# Patient Record
Sex: Female | Born: 1940 | Race: White | Hispanic: No | Smoking: Former smoker
Health system: Southern US, Community
[De-identification: ages and names within clinical notes are randomized; demographics above are authoritative.]

## PROBLEM LIST (undated history)

## (undated) DIAGNOSIS — J302 Other seasonal allergic rhinitis: Secondary | ICD-10-CM

## (undated) DIAGNOSIS — K5730 Diverticulosis of large intestine without perforation or abscess without bleeding: Secondary | ICD-10-CM

## (undated) DIAGNOSIS — R002 Palpitations: Secondary | ICD-10-CM

## (undated) DIAGNOSIS — I10 Essential (primary) hypertension: Secondary | ICD-10-CM

## (undated) DIAGNOSIS — I471 Supraventricular tachycardia, unspecified: Secondary | ICD-10-CM

## (undated) DIAGNOSIS — M1990 Unspecified osteoarthritis, unspecified site: Secondary | ICD-10-CM

## (undated) HISTORY — DX: Essential (primary) hypertension: I10

## (undated) HISTORY — DX: Diverticulosis of large intestine without perforation or abscess without bleeding: K57.30

## (undated) HISTORY — DX: Other seasonal allergic rhinitis: J30.2

## (undated) HISTORY — DX: Supraventricular tachycardia: I47.1

## (undated) HISTORY — PX: DILATION AND CURETTAGE OF UTERUS: SHX78

## (undated) HISTORY — DX: Unspecified osteoarthritis, unspecified site: M19.90

## (undated) HISTORY — DX: Palpitations: R00.2

## (undated) HISTORY — DX: Supraventricular tachycardia, unspecified: I47.10

---

## 1997-02-15 ENCOUNTER — Ambulatory Visit (HOSPITAL_COMMUNITY): Admission: RE | Admit: 1997-02-15 | Discharge: 1997-02-15 | Payer: Self-pay | Admitting: Obstetrics and Gynecology

## 1998-02-18 ENCOUNTER — Other Ambulatory Visit: Admission: RE | Admit: 1998-02-18 | Discharge: 1998-02-18 | Payer: Self-pay | Admitting: Obstetrics and Gynecology

## 1998-02-18 ENCOUNTER — Encounter: Payer: Self-pay | Admitting: Obstetrics and Gynecology

## 1998-02-18 ENCOUNTER — Ambulatory Visit (HOSPITAL_COMMUNITY): Admission: RE | Admit: 1998-02-18 | Discharge: 1998-02-18 | Payer: Self-pay | Admitting: Obstetrics and Gynecology

## 1999-03-24 ENCOUNTER — Ambulatory Visit (HOSPITAL_COMMUNITY): Admission: RE | Admit: 1999-03-24 | Discharge: 1999-03-24 | Payer: Self-pay | Admitting: Obstetrics and Gynecology

## 1999-03-24 ENCOUNTER — Encounter: Payer: Self-pay | Admitting: Obstetrics and Gynecology

## 1999-03-24 ENCOUNTER — Other Ambulatory Visit: Admission: RE | Admit: 1999-03-24 | Discharge: 1999-03-24 | Payer: Self-pay | Admitting: Obstetrics and Gynecology

## 1999-04-06 ENCOUNTER — Encounter: Payer: Self-pay | Admitting: Obstetrics and Gynecology

## 1999-04-06 ENCOUNTER — Ambulatory Visit (HOSPITAL_COMMUNITY): Admission: RE | Admit: 1999-04-06 | Discharge: 1999-04-06 | Payer: Self-pay | Admitting: Obstetrics and Gynecology

## 2000-03-22 ENCOUNTER — Encounter: Payer: Self-pay | Admitting: Obstetrics and Gynecology

## 2000-03-22 ENCOUNTER — Ambulatory Visit (HOSPITAL_COMMUNITY): Admission: RE | Admit: 2000-03-22 | Discharge: 2000-03-22 | Payer: Self-pay | Admitting: Obstetrics and Gynecology

## 2000-03-22 ENCOUNTER — Other Ambulatory Visit: Admission: RE | Admit: 2000-03-22 | Discharge: 2000-03-22 | Payer: Self-pay | Admitting: Obstetrics and Gynecology

## 2001-04-17 ENCOUNTER — Ambulatory Visit (HOSPITAL_COMMUNITY): Admission: RE | Admit: 2001-04-17 | Discharge: 2001-04-17 | Payer: Self-pay | Admitting: Obstetrics and Gynecology

## 2001-04-17 ENCOUNTER — Other Ambulatory Visit: Admission: RE | Admit: 2001-04-17 | Discharge: 2001-04-17 | Payer: Self-pay | Admitting: Obstetrics and Gynecology

## 2001-04-17 ENCOUNTER — Encounter: Payer: Self-pay | Admitting: Obstetrics and Gynecology

## 2002-05-04 ENCOUNTER — Encounter: Payer: Self-pay | Admitting: Obstetrics and Gynecology

## 2002-05-04 ENCOUNTER — Ambulatory Visit (HOSPITAL_COMMUNITY): Admission: RE | Admit: 2002-05-04 | Discharge: 2002-05-04 | Payer: Self-pay | Admitting: Obstetrics and Gynecology

## 2003-03-01 ENCOUNTER — Encounter: Admission: RE | Admit: 2003-03-01 | Discharge: 2003-03-01 | Payer: Self-pay | Admitting: Obstetrics and Gynecology

## 2003-05-18 ENCOUNTER — Encounter: Admission: RE | Admit: 2003-05-18 | Discharge: 2003-05-18 | Payer: Self-pay | Admitting: Obstetrics and Gynecology

## 2003-05-18 ENCOUNTER — Other Ambulatory Visit: Admission: RE | Admit: 2003-05-18 | Discharge: 2003-05-18 | Payer: Self-pay | Admitting: Obstetrics and Gynecology

## 2004-05-22 ENCOUNTER — Encounter: Admission: RE | Admit: 2004-05-22 | Discharge: 2004-05-22 | Payer: Self-pay | Admitting: Obstetrics and Gynecology

## 2004-05-22 ENCOUNTER — Other Ambulatory Visit: Admission: RE | Admit: 2004-05-22 | Discharge: 2004-05-22 | Payer: Self-pay | Admitting: Obstetrics and Gynecology

## 2004-05-31 ENCOUNTER — Encounter: Admission: RE | Admit: 2004-05-31 | Discharge: 2004-05-31 | Payer: Self-pay | Admitting: Obstetrics and Gynecology

## 2005-06-11 ENCOUNTER — Other Ambulatory Visit: Admission: RE | Admit: 2005-06-11 | Discharge: 2005-06-11 | Payer: Self-pay | Admitting: Obstetrics and Gynecology

## 2005-06-11 ENCOUNTER — Encounter: Admission: RE | Admit: 2005-06-11 | Discharge: 2005-06-11 | Payer: Self-pay | Admitting: Obstetrics and Gynecology

## 2005-06-28 ENCOUNTER — Encounter: Admission: RE | Admit: 2005-06-28 | Discharge: 2005-06-28 | Payer: Self-pay | Admitting: Obstetrics and Gynecology

## 2006-06-13 ENCOUNTER — Encounter: Admission: RE | Admit: 2006-06-13 | Discharge: 2006-06-13 | Payer: Self-pay | Admitting: Obstetrics and Gynecology

## 2006-06-13 ENCOUNTER — Other Ambulatory Visit: Admission: RE | Admit: 2006-06-13 | Discharge: 2006-06-13 | Payer: Self-pay | Admitting: Obstetrics and Gynecology

## 2006-10-02 ENCOUNTER — Ambulatory Visit: Payer: Self-pay | Admitting: Gastroenterology

## 2006-10-16 ENCOUNTER — Ambulatory Visit: Payer: Self-pay | Admitting: Gastroenterology

## 2007-06-18 ENCOUNTER — Encounter: Admission: RE | Admit: 2007-06-18 | Discharge: 2007-06-18 | Payer: Self-pay | Admitting: Obstetrics and Gynecology

## 2008-07-20 ENCOUNTER — Other Ambulatory Visit: Admission: RE | Admit: 2008-07-20 | Discharge: 2008-07-20 | Payer: Self-pay | Admitting: Obstetrics and Gynecology

## 2008-07-20 ENCOUNTER — Encounter: Admission: RE | Admit: 2008-07-20 | Discharge: 2008-07-20 | Payer: Self-pay | Admitting: Obstetrics and Gynecology

## 2008-07-26 ENCOUNTER — Encounter: Admission: RE | Admit: 2008-07-26 | Discharge: 2008-07-26 | Payer: Self-pay | Admitting: Obstetrics and Gynecology

## 2009-07-21 ENCOUNTER — Encounter: Admission: RE | Admit: 2009-07-21 | Discharge: 2009-07-21 | Payer: Self-pay | Admitting: Internal Medicine

## 2010-01-29 ENCOUNTER — Encounter: Payer: Self-pay | Admitting: Obstetrics and Gynecology

## 2010-04-18 ENCOUNTER — Other Ambulatory Visit: Payer: Self-pay | Admitting: Internal Medicine

## 2010-04-18 DIAGNOSIS — Z1231 Encounter for screening mammogram for malignant neoplasm of breast: Secondary | ICD-10-CM

## 2010-07-24 ENCOUNTER — Ambulatory Visit
Admission: RE | Admit: 2010-07-24 | Discharge: 2010-07-24 | Disposition: A | Discharge status: Home / Self Care | Payer: Medicare HMO | Source: Ambulatory Visit | Attending: Internal Medicine | Admitting: Internal Medicine

## 2010-07-24 DIAGNOSIS — Z1231 Encounter for screening mammogram for malignant neoplasm of breast: Secondary | ICD-10-CM

## 2011-05-07 ENCOUNTER — Encounter: Payer: Self-pay | Admitting: Gastroenterology

## 2011-06-19 ENCOUNTER — Other Ambulatory Visit: Payer: Self-pay | Admitting: Internal Medicine

## 2011-06-19 DIAGNOSIS — Z1231 Encounter for screening mammogram for malignant neoplasm of breast: Secondary | ICD-10-CM

## 2011-08-28 ENCOUNTER — Ambulatory Visit: Payer: Medicare HMO

## 2011-08-28 ENCOUNTER — Ambulatory Visit
Admission: RE | Admit: 2011-08-28 | Discharge: 2011-08-28 | Disposition: A | Discharge status: Home / Self Care | Payer: PRIVATE HEALTH INSURANCE | Source: Ambulatory Visit | Attending: Internal Medicine | Admitting: Internal Medicine

## 2011-08-28 DIAGNOSIS — Z1231 Encounter for screening mammogram for malignant neoplasm of breast: Secondary | ICD-10-CM

## 2012-05-01 ENCOUNTER — Other Ambulatory Visit: Payer: Self-pay

## 2012-05-01 DIAGNOSIS — Z1231 Encounter for screening mammogram for malignant neoplasm of breast: Secondary | ICD-10-CM

## 2012-09-10 ENCOUNTER — Ambulatory Visit
Admission: RE | Admit: 2012-09-10 | Discharge: 2012-09-10 | Disposition: A | Discharge status: Home / Self Care | Payer: Medicare Other | Source: Ambulatory Visit

## 2012-09-10 DIAGNOSIS — Z1231 Encounter for screening mammogram for malignant neoplasm of breast: Secondary | ICD-10-CM

## 2013-06-22 ENCOUNTER — Other Ambulatory Visit: Payer: Self-pay

## 2013-06-22 DIAGNOSIS — Z1231 Encounter for screening mammogram for malignant neoplasm of breast: Secondary | ICD-10-CM

## 2013-09-11 ENCOUNTER — Ambulatory Visit
Admission: RE | Admit: 2013-09-11 | Discharge: 2013-09-11 | Disposition: A | Discharge status: Home / Self Care | Payer: Medicare HMO | Source: Ambulatory Visit

## 2013-09-11 DIAGNOSIS — Z1231 Encounter for screening mammogram for malignant neoplasm of breast: Secondary | ICD-10-CM

## 2014-06-25 ENCOUNTER — Other Ambulatory Visit: Payer: Self-pay

## 2014-06-25 DIAGNOSIS — Z1231 Encounter for screening mammogram for malignant neoplasm of breast: Secondary | ICD-10-CM

## 2014-09-24 ENCOUNTER — Ambulatory Visit
Admission: RE | Admit: 2014-09-24 | Discharge: 2014-09-24 | Disposition: A | Discharge status: Home / Self Care | Payer: Medicare HMO | Source: Ambulatory Visit

## 2014-09-24 DIAGNOSIS — Z1231 Encounter for screening mammogram for malignant neoplasm of breast: Secondary | ICD-10-CM

## 2015-01-13 DIAGNOSIS — Z418 Encounter for other procedures for purposes other than remedying health state: Secondary | ICD-10-CM | POA: Diagnosis not present

## 2015-01-13 DIAGNOSIS — J329 Chronic sinusitis, unspecified: Secondary | ICD-10-CM | POA: Diagnosis not present

## 2015-01-13 DIAGNOSIS — Z789 Other specified health status: Secondary | ICD-10-CM | POA: Diagnosis not present

## 2015-04-01 DIAGNOSIS — I10 Essential (primary) hypertension: Secondary | ICD-10-CM | POA: Diagnosis not present

## 2015-04-01 DIAGNOSIS — R42 Dizziness and giddiness: Secondary | ICD-10-CM | POA: Diagnosis not present

## 2015-04-01 DIAGNOSIS — K649 Unspecified hemorrhoids: Secondary | ICD-10-CM | POA: Diagnosis not present

## 2015-04-01 DIAGNOSIS — R002 Palpitations: Secondary | ICD-10-CM | POA: Diagnosis not present

## 2015-04-01 DIAGNOSIS — Z6835 Body mass index (BMI) 35.0-35.9, adult: Secondary | ICD-10-CM | POA: Diagnosis not present

## 2015-04-04 DIAGNOSIS — R002 Palpitations: Secondary | ICD-10-CM | POA: Diagnosis not present

## 2015-04-11 DIAGNOSIS — R002 Palpitations: Secondary | ICD-10-CM | POA: Diagnosis not present

## 2015-04-18 DIAGNOSIS — R002 Palpitations: Secondary | ICD-10-CM | POA: Diagnosis not present

## 2015-04-21 DIAGNOSIS — R002 Palpitations: Secondary | ICD-10-CM | POA: Diagnosis not present

## 2015-04-28 ENCOUNTER — Encounter: Payer: Self-pay | Admitting: *Deleted

## 2015-04-29 ENCOUNTER — Encounter: Payer: Self-pay | Admitting: Cardiology

## 2015-04-29 ENCOUNTER — Ambulatory Visit (INDEPENDENT_AMBULATORY_CARE_PROVIDER_SITE_OTHER): Payer: PPO | Admitting: Cardiology

## 2015-04-29 VITALS — BP 112/73 | HR 75 | Ht 68.0 in | Wt 230.2 lb

## 2015-04-29 DIAGNOSIS — Z136 Encounter for screening for cardiovascular disorders: Secondary | ICD-10-CM

## 2015-04-29 DIAGNOSIS — F419 Anxiety disorder, unspecified: Secondary | ICD-10-CM | POA: Diagnosis not present

## 2015-04-29 DIAGNOSIS — I471 Supraventricular tachycardia: Secondary | ICD-10-CM

## 2015-04-29 DIAGNOSIS — I10 Essential (primary) hypertension: Secondary | ICD-10-CM | POA: Diagnosis not present

## 2015-04-29 MED ORDER — METOPROLOL SUCCINATE ER 25 MG PO TB24: 12.5000 mg | ORAL_TABLET | Freq: Every day | ORAL | Status: DC

## 2015-04-29 MED ORDER — LISINOPRIL 2.5 MG PO TABS: 2.5000 mg | ORAL_TABLET | Freq: Every day | ORAL | Status: DC

## 2015-04-29 NOTE — Patient Instructions (Signed)
Your physician has recommended you make the following change in your medication:  Decrease lisinopril to 2.5 mg daily. You may break your 5 mg tablets in half daily until they are finished. Start toprol xl (metoprolol succinate) 12.5 mg daily. Continue all other medications the same. Your physician recommends that you schedule a follow-up appointment in: 3 months.

## 2015-04-29 NOTE — Progress Notes (Signed)
Cardiology Office Note  Date: 04/29/2015   ID: Jermaya, Drucker November 11, 1940, MRN [id number]  PCP: Glenda Chroman, MD  Consulting Cardiologist: Rozann Lesches, MD   Chief Complaint  Patient presents with  . NSVT  . PSVT    History of Present Illness: Meredith Phelps is a 75 y.o. female referred for cardiology consultation by Dr. Woody Seller.I reviewed her history. She states that she has been feeling brief episodes of palpitations, sometimes when she is still at nighttime. She has this rarely during the daytime, has intermittent vertigo which is longer standing, but has had no sudden dizziness or syncope. She does not report any exertional chest pain and has NYHA class II dyspnea.  She wore a recent cardiac monitor per Dr. Woody Seller, I reviewed the available strips. Sinus rhythm was noted as well as rare PVCs and PACs. A total of 13 brief episodes of SVT were noted, longest lasting 19 beats. There was also a single 8 beat run of wide complex tachycardia that was either NSVT, or possibly aberrant SVT. She tells me that she also had an echocardiogram done through the office at [facility], reportedly this looked "okay."  I went over the results of the monitor and showed her the strips. We talked about considering use of a low-dose beta blocker. I went over her remaining medications. She states that her blood pressure is sometimes "low," we also discussed cutting back her lisinopril with the addition of a beta blocker.  Past Medical History  Diagnosis Date  . Essential hypertension   . Palpitations   . Seasonal allergies   . Diverticulosis of colon   . Degenerative joint disease     History reviewed. No pertinent past surgical history.  Current Outpatient Prescriptions  Medication Sig Dispense Refill  . acetaminophen (TYLENOL) 325 MG tablet Take 650 mg by mouth as needed (tries to wait 12 hours after the Aleve).    . diazepam (VALIUM) 5 MG tablet Take 2.5 mg by mouth as needed for  anxiety (nerves, has not taken in about 2 years till recently.).    Marland Kitchen furosemide (LASIX) 20 MG tablet Take 20 mg by mouth daily as needed.    . hydrocortisone 2.5 % cream Apply 1 application topically 3 (three) times daily as needed.    . meclizine (ANTIVERT) 25 MG tablet Take 12.5-25 mg by mouth every 6 (six) hours as needed for dizziness.    . Naproxen Sodium (ALEVE PO) Take 2 tablets by mouth every morning.    . potassium chloride (K-DUR) 10 MEQ tablet Take 10 mEq by mouth daily as needed.    . sodium chloride (OCEAN) 0.65 % SOLN nasal spray Place 1 spray into both nostrils as needed for congestion.    Marland Kitchen lisinopril (PRINIVIL,ZESTRIL) 2.5 MG tablet Take 1 tablet (2.5 mg total) by mouth daily. 90 tablet 3  . metoprolol succinate (TOPROL XL) 25 MG 24 hr tablet Take 0.5 tablets (12.5 mg total) by mouth daily. 45 tablet 3   No current facility-administered medications for this visit.   Allergies:  Ivp dye; Shellfish allergy; and Penicillins   Social History: The patient  reports that she has quit smoking. Her smoking use included Cigarettes. She does not have any smokeless tobacco history on file. She reports that she does not drink alcohol or use illicit drugs.   Family History: The patient's family history includes Heart disease in her mother; Heart failure in her mother.   ROS:  Please see  the history of present illness. Otherwise, complete review of systems is positive for arthritic symptoms.  All other systems are reviewed and negative.   Physical Exam: VS:  BP 112/73 mmHg  Pulse 75  Ht 5\' 8"  (1.727 m)  Wt 230 lb 3.2 oz (104.418 kg)  BMI 35.01 kg/m2  SpO2 94%, BMI Body mass index is 35.01 kg/(m^2).  Wt Readings from Last 3 Encounters:  04/29/15 230 lb 3.2 oz (104.418 kg)    General: Overweight woman, appears comfortable at rest. HEENT: Conjunctiva and lids normal, oropharynx clear. Neck: Supple, no elevated JVP or carotid bruits, no thyromegaly. Lungs: Clear to auscultation,  nonlabored breathing at rest. Cardiac: Regular rate and rhythm, no S3 or significant systolic murmur, no pericardial rub. Abdomen: Soft, nontender, bowel sounds present, no guarding or rebound. Extremities: No pitting edema, distal pulses 2+. Skin: Warm and dry. Musculoskeletal: No kyphosis. Neuropsychiatric: Alert and oriented x3, affect grossly appropriate.  ECG: I reviewed her ECG today which shows sinus rhythm with poor R-wave progression.  Assessment and Plan:  1. History of palpitations, no syncope or chest pain. Recent cardiac monitor shows predominantly episodes of brief SVT, one 8 beat wide complex run which could have been NSVT or perhaps aberrent SVT. She reportedly has normal LVEF by recent echocardiogram, requesting results from [facility] to confirm. In that case would recommend observation and addition of low-dose beta blocker. We are starting Toprol-XL 12.5 mg daily, concurrently reduced dose of lisinopril. Follow-up arranged.  2. Reported history of hypertension, blood pressure is normal today. She has been on lisinopril.  3. Patient also reports problems with anxiety and intermittent panic attacks. She takes Valium as needed.  Current medicines were reviewed with the patient today.   Orders Placed This Encounter  Procedures  . EKG 12-Lead    Disposition: FU with me in 3 months.   Signed, Meredith Sark, MD, [facility] 04/29/2015 4:22 PM    [place] [place] [facility] [place], [place], [place] [address] Phone: [phone]; Fax: [phone]

## 2015-05-17 DIAGNOSIS — Z961 Presence of intraocular lens: Secondary | ICD-10-CM | POA: Diagnosis not present

## 2015-05-17 DIAGNOSIS — H1045 Other chronic allergic conjunctivitis: Secondary | ICD-10-CM | POA: Diagnosis not present

## 2015-07-13 DIAGNOSIS — Z87891 Personal history of nicotine dependence: Secondary | ICD-10-CM | POA: Diagnosis not present

## 2015-07-13 DIAGNOSIS — J069 Acute upper respiratory infection, unspecified: Secondary | ICD-10-CM | POA: Diagnosis not present

## 2015-07-13 DIAGNOSIS — G4700 Insomnia, unspecified: Secondary | ICD-10-CM | POA: Diagnosis not present

## 2015-07-13 DIAGNOSIS — Z299 Encounter for prophylactic measures, unspecified: Secondary | ICD-10-CM | POA: Diagnosis not present

## 2015-07-31 NOTE — Progress Notes (Signed)
Cardiology Office Note  Date: 08/01/2015   ID: Jozey, Schooler 06/08/1940, MRN [id number]  PCP: Glenda Chroman, MD  Primary Cardiologist: Rozann Lesches, MD   Chief Complaint  Patient presents with  . PSVT    History of Present Illness: BRIANI MARICONDA is a 75 y.o. female last seen in April. At that time she was started on low-dose Toprol-XL for management of PSVT. She states that this has been quite effective, does not report any substantial palpitations. Overall pleased with her current symptom control.  She reports a recent URI, treated by Dr. Woody Seller. Cough and chest congestion, some mild wheezing.  Past Medical History:  Diagnosis Date  . Degenerative joint disease   . Diverticulosis of colon   . Essential hypertension   . Palpitations   . Seasonal allergies     Current Outpatient Prescriptions  Medication Sig Dispense Refill  . acetaminophen (TYLENOL) 325 MG tablet Take 650 mg by mouth as needed (tries to wait 12 hours after the Aleve).    . diazepam (VALIUM) 5 MG tablet Take 2.5 mg by mouth as needed for anxiety (nerves, has not taken in about 2 years till recently.).    Marland Kitchen furosemide (LASIX) 20 MG tablet Take 20 mg by mouth daily as needed.    . hydrocortisone 2.5 % cream Apply 1 application topically 3 (three) times daily as needed.    Marland Kitchen lisinopril (PRINIVIL,ZESTRIL) 2.5 MG tablet Take 1 tablet (2.5 mg total) by mouth daily. 90 tablet 3  . meclizine (ANTIVERT) 25 MG tablet Take 12.5-25 mg by mouth every 6 (six) hours as needed for dizziness.    . metoprolol succinate (TOPROL XL) 25 MG 24 hr tablet Take 0.5 tablets (12.5 mg total) by mouth daily. 45 tablet 3  . Naproxen Sodium (ALEVE PO) Take 2 tablets by mouth every morning.    . potassium chloride (K-DUR) 10 MEQ tablet Take 10 mEq by mouth daily as needed.    . sodium chloride (OCEAN) 0.65 % SOLN nasal spray Place 1 spray into both nostrils as needed for congestion.     No current facility-administered  medications for this visit.    Allergies:  Ivp dye [iodinated diagnostic agents]; Shellfish allergy; and Penicillins   Social History: The patient  reports that she has quit smoking. Her smoking use included Cigarettes. She has never used smokeless tobacco. She reports that she does not drink alcohol or use drugs.   ROS:  Please see the history of present illness. Otherwise, complete review of systems is positive for none.  All other systems are reviewed and negative.   Physical Exam: VS:  BP (!) 144/81   Pulse 71   Ht 5\' 8"  (1.727 m)   Wt 228 lb 3.2 oz (103.5 kg)   SpO2 95%   BMI 34.70 kg/m , BMI Body mass index is 34.7 kg/m.  Wt Readings from Last 3 Encounters:  08/01/15 228 lb 3.2 oz (103.5 kg)  04/29/15 230 lb 3.2 oz (104.4 kg)    General: Overweight woman, appears comfortable at rest. HEENT: Conjunctiva and lids normal, oropharynx clear. Neck: Supple, no elevated JVP or carotid bruits, no thyromegaly. Lungs: Clear to auscultation, nonlabored breathing at rest. Cardiac: Regular rate and rhythm, no S3 or significant systolic murmur, no pericardial rub. Abdomen: Soft, nontender, bowel sounds present, no guarding or rebound. Extremities: No pitting edema, distal pulses 2+.  ECG: I personally reviewed the tracing from 04/29/2015 which showed normal sinus rhythm with decreased R  wave progression.  Other Studies Reviewed Today:  Echocardiogram 04/11/2015 [facility]): LVEF [id number], grade 1 diastolic dysfunction, mild left atrial enlargement, trace mitral regurgitation, mildly sclerotic aortic valve, normal estimated RVSP, trivial posterior basal pericardial effusion.  Assessment and Plan:  1. PSVT, typically brief episodes but history of palpitations. This is well controlled now on low-dose Toprol-XL. Continue observation.  2. Essential hypertension. No changes made to current regimen. Keep follow-up with Dr. Woody Seller. Discussed exercise and weight loss today.  Current  medicines were reviewed with the patient today.  Disposition: Follow-up with me in 6 months.  Signed, Satira Sark, MD, [facility] 08/01/2015 10:15 AM    [place] [facility] [place], [place], [place] [address] Phone: [phone]; Fax: [phone]

## 2015-08-01 ENCOUNTER — Encounter: Payer: Self-pay | Admitting: Cardiology

## 2015-08-01 ENCOUNTER — Ambulatory Visit (INDEPENDENT_AMBULATORY_CARE_PROVIDER_SITE_OTHER): Payer: PPO | Admitting: Cardiology

## 2015-08-01 VITALS — BP 144/81 | HR 71 | Ht 68.0 in | Wt 228.2 lb

## 2015-08-01 DIAGNOSIS — I10 Essential (primary) hypertension: Secondary | ICD-10-CM | POA: Diagnosis not present

## 2015-08-01 DIAGNOSIS — I471 Supraventricular tachycardia: Secondary | ICD-10-CM

## 2015-08-01 NOTE — Patient Instructions (Signed)

## 2015-10-04 DIAGNOSIS — Z7189 Other specified counseling: Secondary | ICD-10-CM | POA: Diagnosis not present

## 2015-10-04 DIAGNOSIS — Z1211 Encounter for screening for malignant neoplasm of colon: Secondary | ICD-10-CM | POA: Diagnosis not present

## 2015-10-04 DIAGNOSIS — Z1389 Encounter for screening for other disorder: Secondary | ICD-10-CM | POA: Diagnosis not present

## 2015-10-04 DIAGNOSIS — Z0000 Encounter for general adult medical examination without abnormal findings: Secondary | ICD-10-CM | POA: Diagnosis not present

## 2015-10-04 DIAGNOSIS — R5383 Other fatigue: Secondary | ICD-10-CM | POA: Diagnosis not present

## 2015-10-04 DIAGNOSIS — Z299 Encounter for prophylactic measures, unspecified: Secondary | ICD-10-CM | POA: Diagnosis not present

## 2015-10-04 DIAGNOSIS — Z79899 Other long term (current) drug therapy: Secondary | ICD-10-CM | POA: Diagnosis not present

## 2015-10-10 ENCOUNTER — Other Ambulatory Visit: Payer: Self-pay | Admitting: Internal Medicine

## 2015-10-10 DIAGNOSIS — Z1231 Encounter for screening mammogram for malignant neoplasm of breast: Secondary | ICD-10-CM

## 2015-11-02 ENCOUNTER — Ambulatory Visit
Admission: RE | Admit: 2015-11-02 | Discharge: 2015-11-02 | Disposition: A | Discharge status: Home / Self Care | Payer: PPO | Source: Ambulatory Visit | Attending: Internal Medicine | Admitting: Internal Medicine

## 2015-11-02 DIAGNOSIS — Z1231 Encounter for screening mammogram for malignant neoplasm of breast: Secondary | ICD-10-CM

## 2015-11-03 ENCOUNTER — Other Ambulatory Visit: Payer: Self-pay | Admitting: Internal Medicine

## 2015-11-03 DIAGNOSIS — R928 Other abnormal and inconclusive findings on diagnostic imaging of breast: Secondary | ICD-10-CM

## 2015-11-09 ENCOUNTER — Other Ambulatory Visit: Payer: PPO

## 2015-11-11 ENCOUNTER — Ambulatory Visit
Admission: RE | Admit: 2015-11-11 | Discharge: 2015-11-11 | Disposition: A | Discharge status: Home / Self Care | Payer: PPO | Source: Ambulatory Visit | Attending: Internal Medicine | Admitting: Internal Medicine

## 2015-11-11 DIAGNOSIS — R928 Other abnormal and inconclusive findings on diagnostic imaging of breast: Secondary | ICD-10-CM

## 2015-11-11 DIAGNOSIS — N6489 Other specified disorders of breast: Secondary | ICD-10-CM | POA: Diagnosis not present

## 2015-11-11 DIAGNOSIS — R922 Inconclusive mammogram: Secondary | ICD-10-CM | POA: Diagnosis not present

## 2016-01-25 ENCOUNTER — Ambulatory Visit: Payer: PPO | Admitting: Cardiology

## 2016-02-17 ENCOUNTER — Ambulatory Visit: Payer: PPO | Admitting: Cardiology

## 2016-03-21 NOTE — Progress Notes (Signed)
Cardiology Office Note  Date: 03/23/2016   ID: Meredith Phelps, Alferd Apa May 05, 1940, MRN [id number]  PCP: Glenda Chroman, MD  Primary Cardiologist: Rozann Lesches, MD   Chief Complaint  Patient presents with  . PSVT    History of Present Illness: Meredith Phelps is a 76 y.o. female last seen in July 2017. She presents for a routine follow-up visit. Since last encounter she does not report any significant breakthrough palpitations. No chest pain or unusual shortness of breath. She reports compliance with her medications including low-dose Toprol-XL 12.5 mg daily.  I personally reviewed her ECG today which shows sinus rhythm with poor R wave progression.  Past Medical History:  Diagnosis Date  . Degenerative joint disease   . Diverticulosis of colon   . Essential hypertension   . Palpitations   . PSVT (paroxysmal supraventricular tachycardia) ([place])   . Seasonal allergies     Current Outpatient Prescriptions  Medication Sig Dispense Refill  . acetaminophen (TYLENOL) 325 MG tablet Take 650 mg by mouth as needed (tries to wait 12 hours after the Aleve).    . diazepam (VALIUM) 5 MG tablet Take 2.5 mg by mouth as needed for anxiety (nerves, has not taken in about 2 years till recently.).    Marland Kitchen furosemide (LASIX) 20 MG tablet Take 20 mg by mouth daily as needed.    . hydrocortisone 2.5 % cream Apply 1 application topically 3 (three) times daily as needed.    Marland Kitchen lisinopril (PRINIVIL,ZESTRIL) 2.5 MG tablet Take 1 tablet (2.5 mg total) by mouth daily. 90 tablet 3  . meclizine (ANTIVERT) 25 MG tablet Take 12.5-25 mg by mouth every 6 (six) hours as needed for dizziness.    . metoprolol succinate (TOPROL XL) 25 MG 24 hr tablet Take 0.5 tablets (12.5 mg total) by mouth daily. 45 tablet 3  . Naproxen Sodium (ALEVE PO) Take 2 tablets by mouth every morning.    . potassium chloride (K-DUR) 10 MEQ tablet Take 10 mEq by mouth daily as needed.    . sodium chloride (OCEAN) 0.65 % SOLN nasal spray Place  1 spray into both nostrils as needed for congestion.     No current facility-administered medications for this visit.    Allergies:  Ivp dye [iodinated diagnostic agents]; Shellfish allergy; and Penicillins   Social History: The patient  reports that she has quit smoking. Her smoking use included Cigarettes. She has never used smokeless tobacco. She reports that she does not drink alcohol or use drugs.   ROS:  Please see the history of present illness. Otherwise, complete review of systems is positive for none.  All other systems are reviewed and negative.   Physical Exam: VS:  BP (!) 149/82   Pulse 64   Ht 5\' 8"  (1.727 m)   Wt 233 lb 12.8 oz (106.1 kg)   BMI 35.55 kg/m , BMI Body mass index is 35.55 kg/m.  Wt Readings from Last 3 Encounters:  03/23/16 233 lb 12.8 oz (106.1 kg)  08/01/15 228 lb 3.2 oz (103.5 kg)  04/29/15 230 lb 3.2 oz (104.4 kg)    General: Overweight woman, appears comfortable at rest. HEENT: Conjunctiva and lids normal, oropharynx clear. Neck: Supple, no elevated JVP or carotid bruits, no thyromegaly. Lungs: Clear to auscultation, nonlabored breathing at rest. Cardiac: Regular rate and rhythm, no S3 or significant systolic murmur, no pericardial rub. Abdomen: Soft, nontender, bowel sounds present, no guarding or rebound. Extremities: No pitting edema, distal pulses 2+.  ECG: I personally reviewed the tracing from 04/29/2015 which showed sinus rhythm with poor R-wave progression.  Other Studies Reviewed Today:  Echocardiogram 04/11/2015 [facility]): LVEF 29-19%, grade 1 diastolic dysfunction, mild left atrial enlargement, trace mitral regurgitation, mildly sclerotic aortic valve, normal estimated RVSP, trivial posterior basal pericardial effusion.  Assessment and Plan:  1. History of PSVT, currently well controlled on Toprol-XL 12.5 mg daily. ECG reviewed. We will continue with observation and an annual follow-up for now.  2. Essential  hypertension. She is on lisinopril and Lasix in addition Toprol-XL. Keep follow-up with Dr. Woody Seller.   Current medicines were reviewed with the patient today.   Orders Placed This Encounter  Procedures  . EKG 12-Lead    Disposition: Follow-up in one year.  Signed, Satira Sark, MD, [facility] 03/23/2016 10:17 AM    [place] [facility] [place], [place], [place] [address] Phone: [phone]; Fax: [phone]

## 2016-03-23 ENCOUNTER — Ambulatory Visit (INDEPENDENT_AMBULATORY_CARE_PROVIDER_SITE_OTHER): Payer: PPO | Admitting: Cardiology

## 2016-03-23 ENCOUNTER — Encounter: Payer: Self-pay | Admitting: Cardiology

## 2016-03-23 VITALS — BP 149/82 | HR 64 | Ht 68.0 in | Wt 233.8 lb

## 2016-03-23 DIAGNOSIS — I471 Supraventricular tachycardia: Secondary | ICD-10-CM

## 2016-03-23 DIAGNOSIS — I10 Essential (primary) hypertension: Secondary | ICD-10-CM

## 2016-03-23 NOTE — Patient Instructions (Signed)

## 2016-04-11 ENCOUNTER — Other Ambulatory Visit: Payer: Self-pay | Admitting: Cardiology

## 2016-06-01 ENCOUNTER — Encounter: Payer: Self-pay | Admitting: Internal Medicine

## 2016-06-11 ENCOUNTER — Other Ambulatory Visit: Payer: Self-pay | Admitting: Cardiology

## 2016-06-25 DIAGNOSIS — Z299 Encounter for prophylactic measures, unspecified: Secondary | ICD-10-CM | POA: Diagnosis not present

## 2016-06-25 DIAGNOSIS — K219 Gastro-esophageal reflux disease without esophagitis: Secondary | ICD-10-CM | POA: Diagnosis not present

## 2016-06-25 DIAGNOSIS — Z713 Dietary counseling and surveillance: Secondary | ICD-10-CM | POA: Diagnosis not present

## 2016-06-25 DIAGNOSIS — Z6836 Body mass index (BMI) 36.0-36.9, adult: Secondary | ICD-10-CM | POA: Diagnosis not present

## 2016-06-25 DIAGNOSIS — J329 Chronic sinusitis, unspecified: Secondary | ICD-10-CM | POA: Diagnosis not present

## 2016-10-10 DIAGNOSIS — Z6835 Body mass index (BMI) 35.0-35.9, adult: Secondary | ICD-10-CM | POA: Diagnosis not present

## 2016-10-10 DIAGNOSIS — Z0000 Encounter for general adult medical examination without abnormal findings: Secondary | ICD-10-CM | POA: Diagnosis not present

## 2016-10-10 DIAGNOSIS — Z1339 Encounter for screening examination for other mental health and behavioral disorders: Secondary | ICD-10-CM | POA: Diagnosis not present

## 2016-10-10 DIAGNOSIS — Z1331 Encounter for screening for depression: Secondary | ICD-10-CM | POA: Diagnosis not present

## 2016-10-10 DIAGNOSIS — Z23 Encounter for immunization: Secondary | ICD-10-CM | POA: Diagnosis not present

## 2016-10-10 DIAGNOSIS — Z79899 Other long term (current) drug therapy: Secondary | ICD-10-CM | POA: Diagnosis not present

## 2016-10-10 DIAGNOSIS — R5383 Other fatigue: Secondary | ICD-10-CM | POA: Diagnosis not present

## 2016-10-10 DIAGNOSIS — Z7189 Other specified counseling: Secondary | ICD-10-CM | POA: Diagnosis not present

## 2016-10-10 DIAGNOSIS — R42 Dizziness and giddiness: Secondary | ICD-10-CM | POA: Diagnosis not present

## 2016-10-10 DIAGNOSIS — G4700 Insomnia, unspecified: Secondary | ICD-10-CM | POA: Diagnosis not present

## 2016-10-10 DIAGNOSIS — Z299 Encounter for prophylactic measures, unspecified: Secondary | ICD-10-CM | POA: Diagnosis not present

## 2016-10-10 DIAGNOSIS — I10 Essential (primary) hypertension: Secondary | ICD-10-CM | POA: Diagnosis not present

## 2016-11-06 ENCOUNTER — Other Ambulatory Visit: Payer: Self-pay | Admitting: Internal Medicine

## 2016-11-06 DIAGNOSIS — Z139 Encounter for screening, unspecified: Secondary | ICD-10-CM

## 2016-11-22 DIAGNOSIS — E2839 Other primary ovarian failure: Secondary | ICD-10-CM | POA: Diagnosis not present

## 2016-12-04 DIAGNOSIS — Z961 Presence of intraocular lens: Secondary | ICD-10-CM | POA: Diagnosis not present

## 2016-12-04 DIAGNOSIS — D3131 Benign neoplasm of right choroid: Secondary | ICD-10-CM | POA: Diagnosis not present

## 2016-12-04 DIAGNOSIS — H04123 Dry eye syndrome of bilateral lacrimal glands: Secondary | ICD-10-CM | POA: Diagnosis not present

## 2016-12-06 ENCOUNTER — Ambulatory Visit
Admission: RE | Admit: 2016-12-06 | Discharge: 2016-12-06 | Disposition: A | Discharge status: Home / Self Care | Payer: PPO | Source: Ambulatory Visit | Attending: Internal Medicine | Admitting: Internal Medicine

## 2016-12-06 DIAGNOSIS — Z1231 Encounter for screening mammogram for malignant neoplasm of breast: Secondary | ICD-10-CM | POA: Diagnosis not present

## 2016-12-06 DIAGNOSIS — Z139 Encounter for screening, unspecified: Secondary | ICD-10-CM

## 2017-03-06 DIAGNOSIS — Z299 Encounter for prophylactic measures, unspecified: Secondary | ICD-10-CM | POA: Diagnosis not present

## 2017-03-06 DIAGNOSIS — L570 Actinic keratosis: Secondary | ICD-10-CM | POA: Diagnosis not present

## 2017-03-06 DIAGNOSIS — Z6833 Body mass index (BMI) 33.0-33.9, adult: Secondary | ICD-10-CM | POA: Diagnosis not present

## 2017-03-06 DIAGNOSIS — I10 Essential (primary) hypertension: Secondary | ICD-10-CM | POA: Diagnosis not present

## 2017-03-06 DIAGNOSIS — L820 Inflamed seborrheic keratosis: Secondary | ICD-10-CM | POA: Diagnosis not present

## 2017-03-30 ENCOUNTER — Other Ambulatory Visit: Payer: Self-pay | Admitting: Cardiology

## 2017-04-03 ENCOUNTER — Ambulatory Visit: Payer: PPO | Admitting: Cardiology

## 2017-04-03 DIAGNOSIS — Z6833 Body mass index (BMI) 33.0-33.9, adult: Secondary | ICD-10-CM | POA: Diagnosis not present

## 2017-04-03 DIAGNOSIS — L570 Actinic keratosis: Secondary | ICD-10-CM | POA: Diagnosis not present

## 2017-04-03 DIAGNOSIS — Z299 Encounter for prophylactic measures, unspecified: Secondary | ICD-10-CM | POA: Diagnosis not present

## 2017-04-04 NOTE — Progress Notes (Signed)
Cardiology Office Note  Date: 04/05/2017   ID: Meredith, Phelps 1940/09/07, MRN [id number]  PCP: Glenda Chroman, MD  Primary Cardiologist: Rozann Lesches, MD   Chief Complaint  Patient presents with  . PSVT    History of Present Illness: Meredith Phelps is a 77 y.o. female last seen in March 2018.  She presents today for a routine follow-up visit.  Reports no rapid palpitations on low-dose beta-blocker therapy in the last year.  She has NYHA class II dyspnea, no dizziness or syncope.  I went over her current medications which are listed below.  She continues to follow regularly with Dr. Woody Seller.  I personally reviewed her ECG today which shows sinus rhythm with poor R wave progression.  Past Medical History:  Diagnosis Date  . Degenerative joint disease   . Diverticulosis of colon   . Essential hypertension   . Palpitations   . PSVT (paroxysmal supraventricular tachycardia) ([place])   . Seasonal allergies     Past Surgical History:  Procedure Laterality Date  . DILATION AND CURETTAGE OF UTERUS      Current Outpatient Medications  Medication Sig Dispense Refill  . acetaminophen (TYLENOL) 325 MG tablet Take 650 mg by mouth as needed (tries to wait 12 hours after the Aleve).    . diazepam (VALIUM) 5 MG tablet Take 2.5 mg by mouth as needed for anxiety (nerves, has not taken in about 2 years till recently.).    Marland Kitchen furosemide (LASIX) 20 MG tablet Take 20 mg by mouth daily as needed.    . hydrocortisone 2.5 % cream Apply 1 application topically 3 (three) times daily as needed.    Marland Kitchen lisinopril (PRINIVIL,ZESTRIL) 2.5 MG tablet TAKE 1 TABLET BY MOUTH EVERY DAY 90 tablet 3  . meclizine (ANTIVERT) 25 MG tablet Take 12.5-25 mg by mouth every 6 (six) hours as needed for dizziness.    . metoprolol succinate (TOPROL-XL) 25 MG 24 hr tablet TAKE 1/2 TABLET BY MOUTH EVERY DAY 45 tablet 3  . Naproxen Sodium (ALEVE PO) Take 2 tablets by mouth every morning.    . potassium chloride (K-DUR)  10 MEQ tablet Take 10 mEq by mouth daily as needed.    . sodium chloride (OCEAN) 0.65 % SOLN nasal spray Place 1 spray into both nostrils as needed for congestion.     No current facility-administered medications for this visit.    Allergies:  Ivp dye [iodinated diagnostic agents]; Shellfish allergy; and Penicillins   Social History: The patient  reports that she has quit smoking. Her smoking use included cigarettes. She has never used smokeless tobacco. She reports that she does not drink alcohol or use drugs.   ROS:  Please see the history of present illness. Otherwise, complete review of systems is positive for seasonal allergies.  All other systems are reviewed and negative.   Physical Exam: VS:  BP 130/60   Pulse 68   Ht 5\' 8"  (1.727 m)   Wt 214 lb (97.1 kg)   SpO2 98%   BMI 32.54 kg/m , BMI Body mass index is 32.54 kg/m.  Wt Readings from Last 3 Encounters:  04/05/17 214 lb (97.1 kg)  03/23/16 233 lb 12.8 oz (106.1 kg)  08/01/15 228 lb 3.2 oz (103.5 kg)    General: Patient appears comfortable at rest. HEENT: Conjunctiva and lids normal, oropharynx clear. Neck: Supple, no elevated JVP or carotid bruits, no thyromegaly. Lungs: Clear to auscultation, nonlabored breathing at rest. Cardiac: Regular rate and  rhythm, no S3 or significant systolic murmur. Abdomen: Soft, nontender, bowel sounds present. Extremities: No pitting edema, distal pulses 2+.  ECG: I personally reviewed the tracing from 03/23/2016 which showed sinus rhythm with poor R wave progression.  Other Studies Reviewed Today:  Echocardiogram 04/11/2015 [facility]): LVEF 10-25%, grade 1 diastolic dysfunction, mild left atrial enlargement, trace mitral regurgitation, mildly sclerotic aortic valve, normal estimated RVSP, trivial posterior basal pericardial effusion.  Assessment and Plan:  1.  PSVT, doing well without significant breakthrough symptoms on low-dose Toprol-XL.  I reviewed her ECG.  No  changes made for now.  2.  Essential hypertension, continues to follow with Dr. Woody Seller.  Blood pressure control is adequate today.  Current medicines were reviewed with the patient today.   Orders Placed This Encounter  Procedures  . EKG 12-Lead    Disposition: Follow-up in 1 year.  Signed, Satira Sark, MD, [facility] 04/05/2017 10:17 AM    [place] [facility] [place], [place], [place] [address] Phone: [phone]; Fax: [phone]

## 2017-04-05 ENCOUNTER — Encounter: Payer: Self-pay | Admitting: Cardiology

## 2017-04-05 ENCOUNTER — Ambulatory Visit (INDEPENDENT_AMBULATORY_CARE_PROVIDER_SITE_OTHER): Payer: PPO | Admitting: Cardiology

## 2017-04-05 VITALS — BP 130/60 | HR 68 | Ht 68.0 in | Wt 214.0 lb

## 2017-04-05 DIAGNOSIS — I10 Essential (primary) hypertension: Secondary | ICD-10-CM

## 2017-04-05 DIAGNOSIS — I471 Supraventricular tachycardia: Secondary | ICD-10-CM | POA: Diagnosis not present

## 2017-04-05 NOTE — Patient Instructions (Addendum)

## 2017-05-30 ENCOUNTER — Other Ambulatory Visit: Payer: Self-pay | Admitting: Cardiology

## 2017-09-13 DIAGNOSIS — K589 Irritable bowel syndrome without diarrhea: Secondary | ICD-10-CM | POA: Diagnosis not present

## 2017-09-13 DIAGNOSIS — Z6833 Body mass index (BMI) 33.0-33.9, adult: Secondary | ICD-10-CM | POA: Diagnosis not present

## 2017-09-13 DIAGNOSIS — I10 Essential (primary) hypertension: Secondary | ICD-10-CM | POA: Diagnosis not present

## 2017-09-13 DIAGNOSIS — G4700 Insomnia, unspecified: Secondary | ICD-10-CM | POA: Diagnosis not present

## 2017-09-13 DIAGNOSIS — R197 Diarrhea, unspecified: Secondary | ICD-10-CM | POA: Diagnosis not present

## 2017-09-13 DIAGNOSIS — Z299 Encounter for prophylactic measures, unspecified: Secondary | ICD-10-CM | POA: Diagnosis not present

## 2017-09-23 DIAGNOSIS — I7389 Other specified peripheral vascular diseases: Secondary | ICD-10-CM | POA: Diagnosis not present

## 2017-09-23 DIAGNOSIS — G9009 Other idiopathic peripheral autonomic neuropathy: Secondary | ICD-10-CM | POA: Diagnosis not present

## 2017-09-23 DIAGNOSIS — H81393 Other peripheral vertigo, bilateral: Secondary | ICD-10-CM | POA: Diagnosis not present

## 2017-10-23 DIAGNOSIS — Z1331 Encounter for screening for depression: Secondary | ICD-10-CM | POA: Diagnosis not present

## 2017-10-23 DIAGNOSIS — Z6834 Body mass index (BMI) 34.0-34.9, adult: Secondary | ICD-10-CM | POA: Diagnosis not present

## 2017-10-23 DIAGNOSIS — Z299 Encounter for prophylactic measures, unspecified: Secondary | ICD-10-CM | POA: Diagnosis not present

## 2017-10-23 DIAGNOSIS — K649 Unspecified hemorrhoids: Secondary | ICD-10-CM | POA: Diagnosis not present

## 2017-10-23 DIAGNOSIS — I10 Essential (primary) hypertension: Secondary | ICD-10-CM | POA: Diagnosis not present

## 2017-10-23 DIAGNOSIS — E7800 Pure hypercholesterolemia, unspecified: Secondary | ICD-10-CM | POA: Diagnosis not present

## 2017-10-23 DIAGNOSIS — R5383 Other fatigue: Secondary | ICD-10-CM | POA: Diagnosis not present

## 2017-10-23 DIAGNOSIS — Z23 Encounter for immunization: Secondary | ICD-10-CM | POA: Diagnosis not present

## 2017-10-23 DIAGNOSIS — Z0000 Encounter for general adult medical examination without abnormal findings: Secondary | ICD-10-CM | POA: Diagnosis not present

## 2017-10-23 DIAGNOSIS — Z7189 Other specified counseling: Secondary | ICD-10-CM | POA: Diagnosis not present

## 2017-10-23 DIAGNOSIS — Z1211 Encounter for screening for malignant neoplasm of colon: Secondary | ICD-10-CM | POA: Diagnosis not present

## 2017-10-23 DIAGNOSIS — Z1339 Encounter for screening examination for other mental health and behavioral disorders: Secondary | ICD-10-CM | POA: Diagnosis not present

## 2017-10-24 DIAGNOSIS — E559 Vitamin D deficiency, unspecified: Secondary | ICD-10-CM | POA: Diagnosis not present

## 2017-10-24 DIAGNOSIS — Z79899 Other long term (current) drug therapy: Secondary | ICD-10-CM | POA: Diagnosis not present

## 2017-10-24 DIAGNOSIS — Z713 Dietary counseling and surveillance: Secondary | ICD-10-CM | POA: Diagnosis not present

## 2017-10-24 DIAGNOSIS — R5383 Other fatigue: Secondary | ICD-10-CM | POA: Diagnosis not present

## 2017-10-24 DIAGNOSIS — R42 Dizziness and giddiness: Secondary | ICD-10-CM | POA: Diagnosis not present

## 2017-10-24 DIAGNOSIS — I10 Essential (primary) hypertension: Secondary | ICD-10-CM | POA: Diagnosis not present

## 2017-10-24 DIAGNOSIS — Z6834 Body mass index (BMI) 34.0-34.9, adult: Secondary | ICD-10-CM | POA: Diagnosis not present

## 2017-10-24 DIAGNOSIS — E7800 Pure hypercholesterolemia, unspecified: Secondary | ICD-10-CM | POA: Diagnosis not present

## 2017-10-24 DIAGNOSIS — Z299 Encounter for prophylactic measures, unspecified: Secondary | ICD-10-CM | POA: Diagnosis not present

## 2017-11-25 ENCOUNTER — Other Ambulatory Visit: Payer: Self-pay | Admitting: Internal Medicine

## 2017-11-25 DIAGNOSIS — Z1231 Encounter for screening mammogram for malignant neoplasm of breast: Secondary | ICD-10-CM

## 2018-01-16 ENCOUNTER — Ambulatory Visit: Payer: PPO

## 2018-01-22 DIAGNOSIS — J069 Acute upper respiratory infection, unspecified: Secondary | ICD-10-CM | POA: Diagnosis not present

## 2018-01-22 DIAGNOSIS — R42 Dizziness and giddiness: Secondary | ICD-10-CM | POA: Diagnosis not present

## 2018-01-22 DIAGNOSIS — I10 Essential (primary) hypertension: Secondary | ICD-10-CM | POA: Diagnosis not present

## 2018-01-22 DIAGNOSIS — D485 Neoplasm of uncertain behavior of skin: Secondary | ICD-10-CM | POA: Diagnosis not present

## 2018-01-22 DIAGNOSIS — Z6835 Body mass index (BMI) 35.0-35.9, adult: Secondary | ICD-10-CM | POA: Diagnosis not present

## 2018-01-22 DIAGNOSIS — D045 Carcinoma in situ of skin of trunk: Secondary | ICD-10-CM | POA: Diagnosis not present

## 2018-01-22 DIAGNOSIS — Z299 Encounter for prophylactic measures, unspecified: Secondary | ICD-10-CM | POA: Diagnosis not present

## 2018-01-22 DIAGNOSIS — R600 Localized edema: Secondary | ICD-10-CM | POA: Diagnosis not present

## 2018-02-11 ENCOUNTER — Ambulatory Visit: Payer: PPO

## 2018-02-11 ENCOUNTER — Ambulatory Visit
Admission: RE | Admit: 2018-02-11 | Discharge: 2018-02-11 | Disposition: A | Discharge status: Home / Self Care | Payer: PPO | Source: Ambulatory Visit | Attending: Internal Medicine | Admitting: Internal Medicine

## 2018-02-11 DIAGNOSIS — Z1231 Encounter for screening mammogram for malignant neoplasm of breast: Secondary | ICD-10-CM

## 2018-02-12 DIAGNOSIS — C44529 Squamous cell carcinoma of skin of other part of trunk: Secondary | ICD-10-CM | POA: Diagnosis not present

## 2018-02-12 DIAGNOSIS — C4492 Squamous cell carcinoma of skin, unspecified: Secondary | ICD-10-CM | POA: Diagnosis not present

## 2018-02-12 DIAGNOSIS — Z6835 Body mass index (BMI) 35.0-35.9, adult: Secondary | ICD-10-CM | POA: Diagnosis not present

## 2018-02-12 DIAGNOSIS — I10 Essential (primary) hypertension: Secondary | ICD-10-CM | POA: Diagnosis not present

## 2018-02-12 DIAGNOSIS — Z299 Encounter for prophylactic measures, unspecified: Secondary | ICD-10-CM | POA: Diagnosis not present

## 2018-02-12 DIAGNOSIS — R42 Dizziness and giddiness: Secondary | ICD-10-CM | POA: Diagnosis not present

## 2018-02-12 DIAGNOSIS — Z87891 Personal history of nicotine dependence: Secondary | ICD-10-CM | POA: Diagnosis not present

## 2018-03-23 ENCOUNTER — Other Ambulatory Visit: Payer: Self-pay | Admitting: Cardiology

## 2018-04-03 ENCOUNTER — Telehealth: Payer: Self-pay | Admitting: Cardiology

## 2018-04-03 NOTE — Telephone Encounter (Signed)
returning call

## 2018-04-07 ENCOUNTER — Telehealth: Payer: Self-pay | Admitting: *Deleted

## 2018-04-07 ENCOUNTER — Encounter: Payer: Self-pay | Admitting: *Deleted

## 2018-04-07 NOTE — Telephone Encounter (Signed)
Chart reviewed with patient.  Patient reminded to have weight and vitals ready before the phone call begins.  No hospital visits since last OV.   The patient verbally consented for a telehealth phone visit with [facility] and understands that his/her insurance company will be billed for the encounter.

## 2018-04-08 ENCOUNTER — Telehealth: Payer: Self-pay | Admitting: Cardiology

## 2018-04-08 NOTE — Telephone Encounter (Signed)
No answer- need to prescreen & register patient for visit with [facility] April 09, 2018.    COVID-19 Pre-Screening Questions:   Do you currently have a fever?     Have you recently travelled on a cruise, internationally, or to [place], [place], [place], [place], [place], or [place], [place] [employer]) ?     Have you been in contact with someone that is currently pending confirmation of Covid19 testing or has been confirmed to have the [place] virus?      Are you currently experiencing fatigue or cough?

## 2018-04-08 NOTE — Progress Notes (Signed)
Virtual Visit via Telephone Note    Evaluation Performed:  Follow-up visit  This visit type was conducted due to national recommendations for restrictions regarding the COVID-19 Pandemic (e.g. social distancing).  This format is felt to be most appropriate for this patient at this time.  All issues noted in this document were discussed and addressed.  No physical exam was performed (except for noted visual exam findings with Video Visits).  The patient verbally consented to a telehealth phone visit today with [facility] and understands that their insurance company will be billed for the encounter.  Date:  04/09/2018   ID:  Meredith Phelps, Meredith Phelps 1940-08-07, MRN [id number]  Patient Location:  [place] [address]  Provider location:   [place] at [place], [place], [place] [address] Phone: [phone]; Fax: [phone]  PCP:  Glenda Chroman, MD  Cardiologist: Satira Sark, MD  Chief Complaint: Follow-up SVT  History of Present Illness:    Meredith Phelps is a 78 y.o. female who presents via audio conferencing for a telehealth visit today.  She was last seen in March 2019.  She tells me by phone today that she has been doing well, reports no palpitations or chest pain, no syncope.  She remains functional with ADLs but has been observing social distancing recently.  She has a niece that has been doing all of her grocery shopping for her.  I reviewed her medications which are listed below.  She continues on low-dose metoprolol with good control of SVT.  Lasix and potassium supplements are used only occasionally for leg swelling.  She otherwise continues to follow with Dr. Woody Seller for primary care.  The patient does not have symptoms concerning for COVID-19 infection (fever, chills, cough, or new shortness of breath).   Prior CV studies:   The following studies were reviewed today:  ECG 04/05/2017: I personally reviewed the tracing which  shows sinus rhythm with poor R wave progression.  Echocardiogram 04/11/2015 [facility]): LVEF 10-25%, grade 1 diastolic dysfunction, mild left atrial enlargement, trace mitral regurgitation, mildly sclerotic aortic valve, normal estimated RVSP, trivial posterior basal pericardial effusion.  Past Medical History:  Diagnosis Date  . Degenerative joint disease   . Diverticulosis of colon   . Essential hypertension   . Palpitations   . PSVT (paroxysmal supraventricular tachycardia) ([place])   . Seasonal allergies    Past Surgical History:  Procedure Laterality Date  . DILATION AND CURETTAGE OF UTERUS       Current Meds  Medication Sig  . acetaminophen (TYLENOL) 325 MG tablet Take 650 mg by mouth as needed (tries to wait 12 hours after the Aleve).  . diazepam (VALIUM) 5 MG tablet Take 2.5 mg by mouth as needed for anxiety (nerves, has not taken in about 2 years till recently.).  Marland Kitchen furosemide (LASIX) 20 MG tablet Take 20 mg by mouth daily as needed.  . hydrocortisone 2.5 % cream Apply 1 application topically 3 (three) times daily as needed.  Marland Kitchen lisinopril (PRINIVIL,ZESTRIL) 2.5 MG tablet TAKE ONE TABLET BY MOUTH EVERY DAY  . meclizine (ANTIVERT) 25 MG tablet Take 12.5-25 mg by mouth every 6 (six) hours as needed for dizziness.  . metoprolol succinate (TOPROL-XL) 25 MG 24 hr tablet TAKE 1/2 TABLET BY MOUTH EVERY DAY  . Naproxen Sodium (ALEVE PO) Take 2 tablets by mouth every morning.  . potassium chloride (K-DUR) 10 MEQ tablet Take 10 mEq by mouth daily as  needed.  . sodium chloride (OCEAN) 0.65 % SOLN nasal spray Place 1 spray into both nostrils as needed for congestion.     Allergies:   Ivp dye [iodinated diagnostic agents]; Shellfish allergy; and Penicillins   Social History   Tobacco Use  . Smoking status: Former Smoker    Types: Cigarettes    Last attempt to quit: 01/09/1983    Years since quitting: 35.2  . Smokeless tobacco: Never Used  Substance Use Topics  . Alcohol  use: No    Alcohol/week: 0.0 standard drinks  . Drug use: No     Family Hx: The patient's family history includes Heart disease in her mother; Heart failure in her mother. There is no history of Breast cancer.  ROS:   Please see the history of present illness.    Lower back and leg pain intermittently. All other systems reviewed and are negative.   Labs/Other Tests and Data Reviewed:     Wt Readings from Last 3 Encounters:  04/09/18 219 lb (99.3 kg)  04/05/17 214 lb (97.1 kg)  03/23/16 233 lb 12.8 oz (106.1 kg)     Objective:    Vital Signs:  BP 132/64   Pulse 70   Ht 5\' 8"  (1.727 m)   Wt 219 lb (99.3 kg)   BMI 33.30 kg/m    ASSESSMENT & PLAN:    1.  History of PSVT.  She continues to do well without active palpitations on low-dose beta-blocker.  We will plan to get a follow-up ECG for her next visit.  No changes were made today.  2.  Essential hypertension, recent blood pressure recording shows reasonable control.  Continue with current regimen which also includes low-dose lisinopril.  Keep follow-up with PCP.  COVID-19 Education: The signs and symptoms of COVID-19 were discussed with the patient and how to seek care for testing (follow up with PCP or arrange E-visit).  The importance of social distancing was discussed today.  Patient Risk:   After full review of this patient's clinical status, I feel that they are at least moderate risk at this time.  Time:   Today, I have spent 6 minutes with the patient with telehealth technology discussing control of PSVT symptoms, medications, activities.     Medication Adjustments/Labs and Tests Ordered: Current medicines are reviewed at length with the patient today.  Concerns regarding medicines are outlined above.  Tests Ordered: No orders of the defined types were placed in this encounter.  Medication Changes: No orders of the defined types were placed in this encounter.   Disposition:  Follow up 1 year  Signed,  Rozann Lesches, MD  04/09/2018 9:50 AM    [place]

## 2018-04-09 ENCOUNTER — Telehealth (INDEPENDENT_AMBULATORY_CARE_PROVIDER_SITE_OTHER): Payer: PPO | Admitting: Cardiology

## 2018-04-09 VITALS — BP 132/64 | HR 70 | Ht 68.0 in | Wt 219.0 lb

## 2018-04-09 DIAGNOSIS — I471 Supraventricular tachycardia: Secondary | ICD-10-CM

## 2018-04-09 DIAGNOSIS — I10 Essential (primary) hypertension: Secondary | ICD-10-CM | POA: Diagnosis not present

## 2018-04-09 NOTE — Patient Instructions (Signed)

## 2018-05-23 DIAGNOSIS — I10 Essential (primary) hypertension: Secondary | ICD-10-CM | POA: Diagnosis not present

## 2018-05-23 DIAGNOSIS — Z299 Encounter for prophylactic measures, unspecified: Secondary | ICD-10-CM | POA: Diagnosis not present

## 2018-05-23 DIAGNOSIS — G4700 Insomnia, unspecified: Secondary | ICD-10-CM | POA: Diagnosis not present

## 2018-05-23 DIAGNOSIS — Z713 Dietary counseling and surveillance: Secondary | ICD-10-CM | POA: Diagnosis not present

## 2018-05-24 ENCOUNTER — Other Ambulatory Visit: Payer: Self-pay | Admitting: Cardiology

## 2018-09-26 DIAGNOSIS — Z961 Presence of intraocular lens: Secondary | ICD-10-CM | POA: Diagnosis not present

## 2018-09-26 DIAGNOSIS — H04123 Dry eye syndrome of bilateral lacrimal glands: Secondary | ICD-10-CM | POA: Diagnosis not present

## 2018-09-26 DIAGNOSIS — D3131 Benign neoplasm of right choroid: Secondary | ICD-10-CM | POA: Diagnosis not present

## 2018-10-21 DIAGNOSIS — Z6835 Body mass index (BMI) 35.0-35.9, adult: Secondary | ICD-10-CM | POA: Diagnosis not present

## 2018-10-21 DIAGNOSIS — Z299 Encounter for prophylactic measures, unspecified: Secondary | ICD-10-CM | POA: Diagnosis not present

## 2018-10-21 DIAGNOSIS — I10 Essential (primary) hypertension: Secondary | ICD-10-CM | POA: Diagnosis not present

## 2018-10-21 DIAGNOSIS — I471 Supraventricular tachycardia: Secondary | ICD-10-CM | POA: Diagnosis not present

## 2018-10-21 DIAGNOSIS — Z713 Dietary counseling and surveillance: Secondary | ICD-10-CM | POA: Diagnosis not present

## 2018-10-30 DIAGNOSIS — Z1331 Encounter for screening for depression: Secondary | ICD-10-CM | POA: Diagnosis not present

## 2018-10-30 DIAGNOSIS — Z7189 Other specified counseling: Secondary | ICD-10-CM | POA: Diagnosis not present

## 2018-10-30 DIAGNOSIS — Z1211 Encounter for screening for malignant neoplasm of colon: Secondary | ICD-10-CM | POA: Diagnosis not present

## 2018-10-30 DIAGNOSIS — E7800 Pure hypercholesterolemia, unspecified: Secondary | ICD-10-CM | POA: Diagnosis not present

## 2018-10-30 DIAGNOSIS — I10 Essential (primary) hypertension: Secondary | ICD-10-CM | POA: Diagnosis not present

## 2018-10-30 DIAGNOSIS — Z0000 Encounter for general adult medical examination without abnormal findings: Secondary | ICD-10-CM | POA: Diagnosis not present

## 2018-10-30 DIAGNOSIS — K219 Gastro-esophageal reflux disease without esophagitis: Secondary | ICD-10-CM | POA: Diagnosis not present

## 2018-10-30 DIAGNOSIS — Z299 Encounter for prophylactic measures, unspecified: Secondary | ICD-10-CM | POA: Diagnosis not present

## 2018-10-30 DIAGNOSIS — Z1339 Encounter for screening examination for other mental health and behavioral disorders: Secondary | ICD-10-CM | POA: Diagnosis not present

## 2018-10-30 DIAGNOSIS — R5383 Other fatigue: Secondary | ICD-10-CM | POA: Diagnosis not present

## 2018-10-30 DIAGNOSIS — Z79899 Other long term (current) drug therapy: Secondary | ICD-10-CM | POA: Diagnosis not present

## 2018-10-30 DIAGNOSIS — Z6835 Body mass index (BMI) 35.0-35.9, adult: Secondary | ICD-10-CM | POA: Diagnosis not present

## 2019-01-30 ENCOUNTER — Ambulatory Visit: Payer: PPO | Attending: Internal Medicine

## 2019-01-30 DIAGNOSIS — Z23 Encounter for immunization: Secondary | ICD-10-CM | POA: Insufficient documentation

## 2019-01-30 NOTE — Progress Notes (Signed)
   Covid-19 Vaccination Clinic  Name:  Meredith Phelps    MRN: [id number] DOB: [birth year]  01/30/2019  [name] was observed post Covid-19 immunization for 15 minutes without incidence. She was provided with Vaccine Information Sheet and instruction to access the V-Safe system.   [name] was instructed to call 911 with any severe reactions post vaccine: Marland Kitchen Difficulty breathing  . Swelling of your face and throat  . A fast heartbeat  . A bad rash all over your body  . Dizziness and weakness    Immunizations Administered    Name Date Dose VIS Date Route   Pfizer COVID-19 Vaccine 01/30/2019  6:40 PM 0.3 mL 12/19/2018 Intramuscular   Manufacturer: [place]   Lot: [id number]   [place]: [id number]

## 2019-02-20 ENCOUNTER — Ambulatory Visit: Payer: PPO | Attending: Internal Medicine

## 2019-02-20 ENCOUNTER — Ambulatory Visit: Payer: PPO

## 2019-02-20 DIAGNOSIS — Z23 Encounter for immunization: Secondary | ICD-10-CM | POA: Insufficient documentation

## 2019-02-20 NOTE — Progress Notes (Signed)
   Covid-19 Vaccination Clinic  Name:  Meredith Phelps    MRN: [id number] DOB: [birth year]  02/20/2019  Meredith Phelps was observed post Covid-19 immunization for 15 minutes without incidence. She was provided with Vaccine Information Sheet and instruction to access the V-Safe system.   Meredith Phelps was instructed to call 911 with any severe reactions post vaccine: Marland Kitchen Difficulty breathing  . Swelling of your face and throat  . A fast heartbeat  . A bad rash all over your body  . Dizziness and weakness    Immunizations Administered    Name Date Dose VIS Date Route   Pfizer COVID-19 Vaccine 02/20/2019  8:35 AM 0.3 mL 12/19/2018 Intramuscular   Manufacturer: [place]   Lot: [id number]   [place]: [id number]

## 2019-03-02 ENCOUNTER — Other Ambulatory Visit: Payer: Self-pay | Admitting: Cardiology

## 2019-03-08 DIAGNOSIS — I471 Supraventricular tachycardia: Secondary | ICD-10-CM | POA: Diagnosis not present

## 2019-03-08 DIAGNOSIS — K219 Gastro-esophageal reflux disease without esophagitis: Secondary | ICD-10-CM | POA: Diagnosis not present

## 2019-03-20 ENCOUNTER — Other Ambulatory Visit: Payer: Self-pay | Admitting: Internal Medicine

## 2019-03-20 DIAGNOSIS — Z1231 Encounter for screening mammogram for malignant neoplasm of breast: Secondary | ICD-10-CM

## 2019-04-03 NOTE — Progress Notes (Addendum)
Cardiology Office Note  Date: 04/06/2019   ID: Meredith, Phelps 1940-04-14, MRN [id number]  PCP:  Glenda Chroman, MD  Cardiologist:  Rozann Lesches, MD Electrophysiologist:  None   Chief Complaint: F/U HTN, Hx of SVT  History of Present Illness: Meredith Phelps is a 79 y.o. female with a history of PSVT, hypertension.  Last seen by Dr. Domenic Polite on 04/09/2018.  At that visit she had been doing well reporting no palpitations, chest pain, or syncope.  She was performing all ADLs without any issues.  She was continuing on low-dose metoprolol with good control of SVT.  She was using a Lasix and potassium only occasionally for leg swelling.  Patient states she has been having some right upper quadrant pain that is tender to palpation.  States he ate some food the other morning and had pain afterwards while sitting.  Patient states the pain sometimes radiates over to the mid epigastric region.  States she has been working in her yard mowing and weed eating without any anginal or exertional symptoms.  States none of the pain occurs when she is active or performing normal activities.  Patient thinks this pain may be her gallbladder.  She states she is going to talk to her PCP regarding possible gallbladder ultrasound.  She denies any palpitations or arrhythmias, orthostatic symptoms, stroke or TIA-like symptoms, blood in stool or urine.  Denies any claudication-like symptoms, DVT or PE-like symptoms, or lower extremity edema.  She is very active on a daily basis and denies any anginal or exertional symptoms when active.  States she recently received both her Covid vaccinations.    Past Medical History:  Diagnosis Date  . Degenerative joint disease   . Diverticulosis of colon   . Essential hypertension   . Palpitations   . PSVT (paroxysmal supraventricular tachycardia) ([place])   . Seasonal allergies     Past Surgical History:  Procedure Laterality Date  . DILATION AND CURETTAGE OF  UTERUS      Current Outpatient Medications  Medication Sig Dispense Refill  . acetaminophen (TYLENOL) 325 MG tablet Take 650 mg by mouth as needed (tries to wait 12 hours after the Aleve).    Marland Kitchen aspirin EC 81 MG tablet Take 81 mg by mouth daily.    . diazepam (VALIUM) 5 MG tablet Take 2.5 mg by mouth as needed for anxiety (nerves, has not taken in about 2 years till recently.).    Marland Kitchen furosemide (LASIX) 20 MG tablet Take 20 mg by mouth daily as needed.    . hydrocortisone 2.5 % cream Apply 1 application topically 3 (three) times daily as needed.    Marland Kitchen lisinopril (ZESTRIL) 2.5 MG tablet TAKE 1 TABLET BY MOUTH EVERY DAY 90 tablet 3  . meclizine (ANTIVERT) 25 MG tablet Take 12.5-25 mg by mouth every 6 (six) hours as needed for dizziness.    . metoprolol succinate (TOPROL-XL) 25 MG 24 hr tablet TAKE 1/2 TABLET BY MOUTH EVERY DAY 45 tablet 3  . Naproxen Sodium (ALEVE PO) Take 2 tablets by mouth every morning.    Marland Kitchen omeprazole (PRILOSEC) 20 MG capsule Take 20 mg by mouth daily.    . potassium chloride (K-DUR) 10 MEQ tablet Take 10 mEq by mouth daily as needed.    . sodium chloride (OCEAN) 0.65 % SOLN nasal spray Place 1 spray into both nostrils as needed for congestion.     No current facility-administered medications for this visit.   Allergies:  Ivp  dye [iodinated diagnostic agents], Shellfish allergy, and Penicillins   Social History: The patient  reports that she quit smoking about 36 years ago. Her smoking use included cigarettes. She has never used smokeless tobacco. She reports that she does not drink alcohol or use drugs.   Family History: The patient's family history includes Heart disease in her mother; Heart failure in her mother.   ROS:  Please see the history of present illness. Otherwise, complete review of systems is positive for none.  All other systems are reviewed and negative.   Physical Exam: VS:  BP 130/80   Pulse 69   Ht 5\' 8"  (1.727 m)   Wt 227 lb (103 kg)   BMI 34.52  kg/m , BMI Body mass index is 34.52 kg/m.  Wt Readings from Last 3 Encounters:  04/06/19 227 lb (103 kg)  04/09/18 219 lb (99.3 kg)  04/05/17 214 lb (97.1 kg)    General: Patient appears comfortable at rest. Neck: Supple, no elevated JVP or carotid bruits, no thyromegaly. Lungs: Clear to auscultation, nonlabored breathing at rest. Cardiac: Regular rate and rhythm, no S3 or significant systolic murmur, no pericardial rub. Abdomen: Soft, nontender, no hepatomegaly, has some right upper quadrant tenderness to palpation  Extremities: No pitting edema, distal pulses 2+. Skin: Warm and dry. Musculoskeletal: No kyphosis. Neuropsychiatric: Alert and oriented x3, affect grossly appropriate.  ECG:  An ECG dated 04/06/2019 was personally reviewed today and demonstrated:  Normal sinus rhythm rate of 69, minimal voltage criteria for LVH, may be a normal variant, inferior infarct age undetermined, cannot rule out anterior infarct, age undetermined.  Recent Labwork: No results found for requested labs within last 8760 hours.  No results found for: CHOL, TRIG, HDL, CHOLHDL, VLDL, LDLCALC, LDLDIRECT  Other Studies Reviewed Today:  Echocardiogram 04/11/2015 [facility]): LVEF 123456, grade 1 diastolic dysfunction, mild left atrial enlargement, trace mitral regurgitation, mildly sclerotic aortic valve, normal estimated RVSP, trivial posterior basal pericardial effusion  Assessment and Plan:  1. Essential hypertension   2. PSVT (paroxysmal supraventricular tachycardia) ([place])   3. Right upper quadrant pain    1. Essential hypertension Blood pressure well controlled at 130/80 today.  Continue lisinopril 2.5 mg daily.  2. PSVT (paroxysmal supraventricular tachycardia) ([place]) Patient denies any palpitations or arrhythmias recently.  EKG today shows normal sinus rhythm with no ectopy to be noted.  Continue metoprolol 25 mg 1/2 tablet daily.  3. Right upper quadrant pain Patient recently had  some right upper quadrant pain after eating grapes and drinking coffee 1 particular morning.  States she also had 2 episodes of diarrhea after the fact.  She has had no recurrence other than mild pain.  States it sometimes radiates from right upper quadrant over to mid epigastric region.  States her right upper quadrant is tender to palpation.  States she has some intermittent mild pain which she describes as a nuisance.  She states she believes it is her gallbladder and wants to follow-up with Dr. Woody Seller for possible gallbladder ultrasound.  She states she can perform mowing and weed eating in her yard and has no anginal or exertional symptoms during these activities.  She states this 1 particular episode happened while she was not active.  Medication Adjustments/Labs and Tests Ordered: Current medicines are reviewed at length with the patient today.  Concerns regarding medicines are outlined above.   Disposition: Follow-up with Dr. Domenic Polite or APP in 1 year Signed, Levell July, NP 04/06/2019 10:06 AM    [facility] at [place], [place], [place] [address] Phone: [phone]; Fax: [phone]

## 2019-04-06 ENCOUNTER — Ambulatory Visit (INDEPENDENT_AMBULATORY_CARE_PROVIDER_SITE_OTHER): Payer: PPO | Admitting: Family Medicine

## 2019-04-06 ENCOUNTER — Encounter: Payer: Self-pay | Admitting: Family Medicine

## 2019-04-06 ENCOUNTER — Other Ambulatory Visit: Payer: Self-pay

## 2019-04-06 VITALS — BP 130/80 | HR 69 | Ht 68.0 in | Wt 227.0 lb

## 2019-04-06 DIAGNOSIS — I471 Supraventricular tachycardia: Secondary | ICD-10-CM

## 2019-04-06 DIAGNOSIS — I10 Essential (primary) hypertension: Secondary | ICD-10-CM | POA: Diagnosis not present

## 2019-04-06 DIAGNOSIS — R1011 Right upper quadrant pain: Secondary | ICD-10-CM

## 2019-04-06 NOTE — Patient Instructions (Addendum)

## 2019-05-04 ENCOUNTER — Ambulatory Visit
Admission: RE | Admit: 2019-05-04 | Discharge: 2019-05-04 | Disposition: A | Discharge status: Home / Self Care | Payer: PPO | Source: Ambulatory Visit | Attending: Internal Medicine | Admitting: Internal Medicine

## 2019-05-04 ENCOUNTER — Other Ambulatory Visit: Payer: Self-pay

## 2019-05-04 DIAGNOSIS — Z1231 Encounter for screening mammogram for malignant neoplasm of breast: Secondary | ICD-10-CM

## 2019-05-05 ENCOUNTER — Other Ambulatory Visit: Payer: Self-pay | Admitting: Internal Medicine

## 2019-05-05 DIAGNOSIS — R928 Other abnormal and inconclusive findings on diagnostic imaging of breast: Secondary | ICD-10-CM

## 2019-05-15 ENCOUNTER — Other Ambulatory Visit: Payer: Self-pay

## 2019-05-15 ENCOUNTER — Ambulatory Visit
Admission: RE | Admit: 2019-05-15 | Discharge: 2019-05-15 | Disposition: A | Discharge status: Home / Self Care | Payer: PPO | Source: Ambulatory Visit | Attending: Internal Medicine | Admitting: Internal Medicine

## 2019-05-15 ENCOUNTER — Ambulatory Visit: Payer: PPO

## 2019-05-15 DIAGNOSIS — R928 Other abnormal and inconclusive findings on diagnostic imaging of breast: Secondary | ICD-10-CM

## 2019-05-23 ENCOUNTER — Other Ambulatory Visit: Payer: Self-pay | Admitting: Cardiology

## 2019-06-08 DIAGNOSIS — K219 Gastro-esophageal reflux disease without esophagitis: Secondary | ICD-10-CM | POA: Diagnosis not present

## 2019-06-08 DIAGNOSIS — I10 Essential (primary) hypertension: Secondary | ICD-10-CM | POA: Diagnosis not present

## 2019-09-08 DIAGNOSIS — E7849 Other hyperlipidemia: Secondary | ICD-10-CM | POA: Diagnosis not present

## 2019-09-08 DIAGNOSIS — I10 Essential (primary) hypertension: Secondary | ICD-10-CM | POA: Diagnosis not present

## 2019-09-08 DIAGNOSIS — K219 Gastro-esophageal reflux disease without esophagitis: Secondary | ICD-10-CM | POA: Diagnosis not present

## 2019-10-06 DIAGNOSIS — S83241D Other tear of medial meniscus, current injury, right knee, subsequent encounter: Secondary | ICD-10-CM | POA: Diagnosis not present

## 2019-11-04 DIAGNOSIS — Z0000 Encounter for general adult medical examination without abnormal findings: Secondary | ICD-10-CM | POA: Diagnosis not present

## 2019-11-04 DIAGNOSIS — E7800 Pure hypercholesterolemia, unspecified: Secondary | ICD-10-CM | POA: Diagnosis not present

## 2019-11-04 DIAGNOSIS — Z7189 Other specified counseling: Secondary | ICD-10-CM | POA: Diagnosis not present

## 2019-11-04 DIAGNOSIS — I10 Essential (primary) hypertension: Secondary | ICD-10-CM | POA: Diagnosis not present

## 2019-11-04 DIAGNOSIS — R5383 Other fatigue: Secondary | ICD-10-CM | POA: Diagnosis not present

## 2019-11-04 DIAGNOSIS — Z6834 Body mass index (BMI) 34.0-34.9, adult: Secondary | ICD-10-CM | POA: Diagnosis not present

## 2019-11-04 DIAGNOSIS — Z79899 Other long term (current) drug therapy: Secondary | ICD-10-CM | POA: Diagnosis not present

## 2019-11-04 DIAGNOSIS — Z1339 Encounter for screening examination for other mental health and behavioral disorders: Secondary | ICD-10-CM | POA: Diagnosis not present

## 2019-11-04 DIAGNOSIS — Z299 Encounter for prophylactic measures, unspecified: Secondary | ICD-10-CM | POA: Diagnosis not present

## 2019-11-04 DIAGNOSIS — Z1331 Encounter for screening for depression: Secondary | ICD-10-CM | POA: Diagnosis not present

## 2019-11-06 DIAGNOSIS — K219 Gastro-esophageal reflux disease without esophagitis: Secondary | ICD-10-CM | POA: Diagnosis not present

## 2019-11-06 DIAGNOSIS — E7849 Other hyperlipidemia: Secondary | ICD-10-CM | POA: Diagnosis not present

## 2019-11-06 DIAGNOSIS — I10 Essential (primary) hypertension: Secondary | ICD-10-CM | POA: Diagnosis not present

## 2019-11-15 ENCOUNTER — Other Ambulatory Visit: Payer: Self-pay | Admitting: Cardiology

## 2019-11-30 ENCOUNTER — Other Ambulatory Visit: Payer: Self-pay | Admitting: Family Medicine

## 2019-11-30 NOTE — Telephone Encounter (Signed)
Refills sent to [facility] 11/16/19

## 2019-11-30 NOTE — Telephone Encounter (Signed)
NEW MESSAGE     *STAT* If patient is at the pharmacy, call can be transferred to refill team.   1. Which medications need to be refilled? (please list name of each medication and dose if known) lisinopril (ZESTRIL) 2.5 MG tablet  2. Which pharmacy/location (including street and city if local pharmacy) is medication to be sent to? [facility]  3. Do they need a 30 day or 90 day supply?  [place]

## 2019-12-01 ENCOUNTER — Telehealth: Payer: Self-pay | Admitting: Cardiology

## 2019-12-01 NOTE — Telephone Encounter (Signed)
New message    Patient is out of medication -   *STAT* If patient is at the pharmacy, call can be transferred to refill team.   1. Which medications need to be refilled? (please list name of each medication and dose if known) lisinopril (ZESTRIL) 2.5 MG tablet  2. Which pharmacy/location (including street and city if local pharmacy) is medication to be sent to? [facility]   3. Do they need a 30 day or 90 day supply?[place]

## 2019-12-01 NOTE — Telephone Encounter (Signed)
Medication sent to [facility] with confirmation on 11/16/2019

## 2019-12-15 ENCOUNTER — Other Ambulatory Visit: Payer: Self-pay | Admitting: Cardiology

## 2020-01-08 DIAGNOSIS — K219 Gastro-esophageal reflux disease without esophagitis: Secondary | ICD-10-CM | POA: Diagnosis not present

## 2020-01-08 DIAGNOSIS — E7849 Other hyperlipidemia: Secondary | ICD-10-CM | POA: Diagnosis not present

## 2020-01-08 DIAGNOSIS — I10 Essential (primary) hypertension: Secondary | ICD-10-CM | POA: Diagnosis not present

## 2020-02-08 DIAGNOSIS — K219 Gastro-esophageal reflux disease without esophagitis: Secondary | ICD-10-CM | POA: Diagnosis not present

## 2020-02-08 DIAGNOSIS — I10 Essential (primary) hypertension: Secondary | ICD-10-CM | POA: Diagnosis not present

## 2020-02-08 DIAGNOSIS — E7849 Other hyperlipidemia: Secondary | ICD-10-CM | POA: Diagnosis not present

## 2020-04-06 DIAGNOSIS — E7849 Other hyperlipidemia: Secondary | ICD-10-CM | POA: Diagnosis not present

## 2020-04-06 DIAGNOSIS — M109 Gout, unspecified: Secondary | ICD-10-CM | POA: Diagnosis not present

## 2020-04-06 DIAGNOSIS — K219 Gastro-esophageal reflux disease without esophagitis: Secondary | ICD-10-CM | POA: Diagnosis not present

## 2020-04-08 NOTE — Progress Notes (Signed)
Cardiology Office Note  Date: 04/11/2020   ID: Meredith, Phelps 09-Oct-1940, MRN [id number]  PCP:  Meredith Chroman, MD  Cardiologist:  Meredith Lesches, MD Electrophysiologist:  None   Chief Complaint  Patient presents with  . Cardiac follow-up    History of Present Illness: Meredith Phelps is an 80 y.o. female in March 2021 by Mr. Leonides Sake NP.  She presents for a routine visit.  Reports no palpitations or chest pain since last encounter.  She has been less active during the pandemic and has gained weight over the last few years.  I personally reviewed her ECG today which shows normal sinus rhythm with low voltage.  We went over her medications, she remains on low-dose Toprol-XL and also low-dose lisinopril.  Blood pressure was elevated today, I asked her to check it intermittently with her automatic cuff and continue to see Meredith Phelps in case further adjustments are necessary.  She does not describe any other major health changes.  Past Medical History:  Diagnosis Date  . Degenerative joint disease   . Diverticulosis of colon   . Essential hypertension   . Palpitations   . PSVT (paroxysmal supraventricular tachycardia) ([place])   . Seasonal allergies     Past Surgical History:  Procedure Laterality Date  . DILATION AND CURETTAGE OF UTERUS      Current Outpatient Medications  Medication Sig Dispense Refill  . acetaminophen (TYLENOL) 325 MG tablet Take 650 mg by mouth as needed (tries to wait 12 hours after the Aleve).    Marland Kitchen aspirin EC 81 MG tablet Take 81 mg by mouth daily.    . diazepam (VALIUM) 5 MG tablet Take 2.5 mg by mouth as needed for anxiety (nerves, has not taken in about 2 years till recently.).    Marland Kitchen furosemide (LASIX) 20 MG tablet Take 20 mg by mouth daily as needed.    . hydrocortisone 2.5 % cream Apply 1 application topically 3 (three) times daily as needed.    Marland Kitchen lisinopril (ZESTRIL) 2.5 MG tablet TAKE 1 TABLET BY MOUTH EVERY DAY 90 tablet 1  . meclizine  (ANTIVERT) 25 MG tablet Take 12.5-25 mg by mouth every 6 (six) hours as needed for dizziness.    . metoprolol succinate (TOPROL-XL) 25 MG 24 hr tablet TAKE 1/2 TABLET BY MOUTH EVERY DAY 45 tablet 3  . Naproxen Sodium (ALEVE PO) Take 2 tablets by mouth every morning.    Marland Kitchen omeprazole (PRILOSEC) 20 MG capsule Take 20 mg by mouth daily.    . potassium chloride (K-DUR) 10 MEQ tablet Take 10 mEq by mouth daily as needed.    . sodium chloride (OCEAN) 0.65 % SOLN nasal spray Place 1 spray into both nostrils as needed for congestion.     No current facility-administered medications for this visit.   Allergies:  Ivp dye [iodinated diagnostic agents], Shellfish allergy, and Penicillins   ROS: No dizziness or syncope.  Physical Exam: VS:  BP (!) 152/70   Pulse 66   Ht 5\' 8"  (1.727 m)   Wt 232 lb 3.2 oz (105.3 kg)   SpO2 97%   BMI 35.31 kg/m , BMI Body mass index is 35.31 kg/m.  Wt Readings from Last 3 Encounters:  04/11/20 232 lb 3.2 oz (105.3 kg)  04/06/19 227 lb (103 kg)  04/09/18 219 lb (99.3 kg)    General: Patient appears comfortable at rest. HEENT: Conjunctiva and lids normal, wearing a mask. Neck: Supple, no elevated JVP or carotid  bruits, no thyromegaly. Lungs: Clear to auscultation, nonlabored breathing at rest. Cardiac: Regular rate and rhythm, no S3 or significant systolic murmur, no pericardial rub. Extremities: No pitting edema.  ECG:  An ECG dated 04/06/2019 was personally reviewed today and demonstrated:  Sinus rhythm with LVH, leftward axis, decreased R wave progression.  Recent Labwork:  No interval lab work for review today.  Other Studies Reviewed Today:  Echocardiogram 04/11/2015 [facility]): LVEF 50-75%, grade 1 diastolic dysfunction, mild left atrial enlargement, trace mitral regurgitation, mildly sclerotic aortic valve, normal estimated RVSP, trivial posterior basal pericardial effusion.  Assessment and Plan:  1.  PSVT, asymptomatic at this point  with no palpitations on low-dose Toprol-XL.  ECG reviewed.  Continue observation.  2.  Essential hypertension, blood pressure mildly elevated today.  I asked her to check blood pressure at home with automatic cuff, continue to see Meredith Phelps.  Lisinopril could be further uptitrated if necessary.  Weight loss would also be helpful.  Medication Adjustments/Labs and Tests Ordered: Current medicines are reviewed at length with the patient today.  Concerns regarding medicines are outlined above.   Tests Ordered: No orders of the defined types were placed in this encounter.   Medication Changes: No orders of the defined types were placed in this encounter.   Disposition:  Follow up 1 year.  Signed, Meredith Sark, MD, [facility] 04/11/2020 10:10 AM    [facility] at [place]. [address], [place], [place] [address] Phone: [phone]; Fax: [phone]

## 2020-04-11 ENCOUNTER — Ambulatory Visit (INDEPENDENT_AMBULATORY_CARE_PROVIDER_SITE_OTHER): Payer: PPO | Admitting: Cardiology

## 2020-04-11 ENCOUNTER — Encounter: Payer: Self-pay | Admitting: Cardiology

## 2020-04-11 ENCOUNTER — Other Ambulatory Visit: Payer: Self-pay

## 2020-04-11 VITALS — BP 152/70 | HR 66 | Ht 68.0 in | Wt 232.2 lb

## 2020-04-11 DIAGNOSIS — I10 Essential (primary) hypertension: Secondary | ICD-10-CM

## 2020-04-11 DIAGNOSIS — I471 Supraventricular tachycardia: Secondary | ICD-10-CM | POA: Diagnosis not present

## 2020-04-11 NOTE — Patient Instructions (Signed)

## 2020-04-11 NOTE — Addendum Note (Signed)
Addended by: Christella Scheuermann C on: 04/11/2020 10:16 AM   Modules accepted: Orders

## 2020-05-06 ENCOUNTER — Other Ambulatory Visit: Payer: Self-pay | Admitting: Internal Medicine

## 2020-05-06 DIAGNOSIS — Z1231 Encounter for screening mammogram for malignant neoplasm of breast: Secondary | ICD-10-CM

## 2020-05-15 ENCOUNTER — Other Ambulatory Visit: Payer: Self-pay | Admitting: Cardiology

## 2020-06-10 DIAGNOSIS — Z23 Encounter for immunization: Secondary | ICD-10-CM | POA: Diagnosis not present

## 2020-06-29 ENCOUNTER — Ambulatory Visit
Admission: RE | Admit: 2020-06-29 | Discharge: 2020-06-29 | Disposition: A | Discharge status: Home / Self Care | Payer: PPO | Source: Ambulatory Visit | Attending: Internal Medicine | Admitting: Internal Medicine

## 2020-06-29 ENCOUNTER — Other Ambulatory Visit: Payer: Self-pay

## 2020-06-29 DIAGNOSIS — Z1231 Encounter for screening mammogram for malignant neoplasm of breast: Secondary | ICD-10-CM

## 2020-07-07 DIAGNOSIS — K219 Gastro-esophageal reflux disease without esophagitis: Secondary | ICD-10-CM | POA: Diagnosis not present

## 2020-07-07 DIAGNOSIS — E7849 Other hyperlipidemia: Secondary | ICD-10-CM | POA: Diagnosis not present

## 2020-07-07 DIAGNOSIS — I10 Essential (primary) hypertension: Secondary | ICD-10-CM | POA: Diagnosis not present

## 2020-09-29 DIAGNOSIS — H26493 Other secondary cataract, bilateral: Secondary | ICD-10-CM | POA: Diagnosis not present

## 2020-09-29 DIAGNOSIS — H43811 Vitreous degeneration, right eye: Secondary | ICD-10-CM | POA: Diagnosis not present

## 2020-09-29 DIAGNOSIS — D3131 Benign neoplasm of right choroid: Secondary | ICD-10-CM | POA: Diagnosis not present

## 2020-09-29 DIAGNOSIS — H35363 Drusen (degenerative) of macula, bilateral: Secondary | ICD-10-CM | POA: Diagnosis not present

## 2020-09-29 DIAGNOSIS — H35372 Puckering of macula, left eye: Secondary | ICD-10-CM | POA: Diagnosis not present

## 2020-10-12 DIAGNOSIS — R5383 Other fatigue: Secondary | ICD-10-CM | POA: Diagnosis not present

## 2020-10-12 DIAGNOSIS — Z0000 Encounter for general adult medical examination without abnormal findings: Secondary | ICD-10-CM | POA: Diagnosis not present

## 2020-10-12 DIAGNOSIS — I10 Essential (primary) hypertension: Secondary | ICD-10-CM | POA: Diagnosis not present

## 2020-10-12 DIAGNOSIS — E7800 Pure hypercholesterolemia, unspecified: Secondary | ICD-10-CM | POA: Diagnosis not present

## 2020-10-12 DIAGNOSIS — Z6834 Body mass index (BMI) 34.0-34.9, adult: Secondary | ICD-10-CM | POA: Diagnosis not present

## 2020-10-12 DIAGNOSIS — Z87891 Personal history of nicotine dependence: Secondary | ICD-10-CM | POA: Diagnosis not present

## 2020-10-12 DIAGNOSIS — Z1331 Encounter for screening for depression: Secondary | ICD-10-CM | POA: Diagnosis not present

## 2020-10-12 DIAGNOSIS — Z299 Encounter for prophylactic measures, unspecified: Secondary | ICD-10-CM | POA: Diagnosis not present

## 2020-10-12 DIAGNOSIS — Z2821 Immunization not carried out because of patient refusal: Secondary | ICD-10-CM | POA: Diagnosis not present

## 2020-10-12 DIAGNOSIS — Z79899 Other long term (current) drug therapy: Secondary | ICD-10-CM | POA: Diagnosis not present

## 2020-10-12 DIAGNOSIS — Z7189 Other specified counseling: Secondary | ICD-10-CM | POA: Diagnosis not present

## 2020-10-18 DIAGNOSIS — E876 Hypokalemia: Secondary | ICD-10-CM | POA: Diagnosis not present

## 2020-11-01 DIAGNOSIS — I10 Essential (primary) hypertension: Secondary | ICD-10-CM | POA: Diagnosis not present

## 2020-11-01 DIAGNOSIS — Z299 Encounter for prophylactic measures, unspecified: Secondary | ICD-10-CM | POA: Diagnosis not present

## 2020-11-01 DIAGNOSIS — L723 Sebaceous cyst: Secondary | ICD-10-CM | POA: Diagnosis not present

## 2020-11-01 DIAGNOSIS — L089 Local infection of the skin and subcutaneous tissue, unspecified: Secondary | ICD-10-CM | POA: Diagnosis not present

## 2020-11-01 DIAGNOSIS — I471 Supraventricular tachycardia: Secondary | ICD-10-CM | POA: Diagnosis not present

## 2020-11-07 DIAGNOSIS — J309 Allergic rhinitis, unspecified: Secondary | ICD-10-CM | POA: Diagnosis not present

## 2020-11-07 DIAGNOSIS — R002 Palpitations: Secondary | ICD-10-CM | POA: Diagnosis not present

## 2020-11-08 ENCOUNTER — Other Ambulatory Visit: Payer: Self-pay | Admitting: Cardiology

## 2020-11-08 DIAGNOSIS — L720 Epidermal cyst: Secondary | ICD-10-CM | POA: Diagnosis not present

## 2020-11-08 DIAGNOSIS — J069 Acute upper respiratory infection, unspecified: Secondary | ICD-10-CM | POA: Diagnosis not present

## 2020-11-08 DIAGNOSIS — Z87891 Personal history of nicotine dependence: Secondary | ICD-10-CM | POA: Diagnosis not present

## 2020-11-08 DIAGNOSIS — I10 Essential (primary) hypertension: Secondary | ICD-10-CM | POA: Diagnosis not present

## 2020-11-08 DIAGNOSIS — Z6835 Body mass index (BMI) 35.0-35.9, adult: Secondary | ICD-10-CM | POA: Diagnosis not present

## 2020-11-08 DIAGNOSIS — Z299 Encounter for prophylactic measures, unspecified: Secondary | ICD-10-CM | POA: Diagnosis not present

## 2020-11-08 DIAGNOSIS — I471 Supraventricular tachycardia: Secondary | ICD-10-CM | POA: Diagnosis not present

## 2020-11-16 DIAGNOSIS — Z91013 Allergy to seafood: Secondary | ICD-10-CM | POA: Diagnosis not present

## 2020-11-16 DIAGNOSIS — I471 Supraventricular tachycardia: Secondary | ICD-10-CM | POA: Diagnosis not present

## 2020-11-16 DIAGNOSIS — Z79899 Other long term (current) drug therapy: Secondary | ICD-10-CM | POA: Diagnosis not present

## 2020-11-16 DIAGNOSIS — Z87891 Personal history of nicotine dependence: Secondary | ICD-10-CM | POA: Diagnosis not present

## 2020-11-16 DIAGNOSIS — Z880 Allergy status to penicillin: Secondary | ICD-10-CM | POA: Diagnosis not present

## 2020-11-16 DIAGNOSIS — Z01818 Encounter for other preprocedural examination: Secondary | ICD-10-CM | POA: Diagnosis not present

## 2020-11-16 DIAGNOSIS — K219 Gastro-esophageal reflux disease without esophagitis: Secondary | ICD-10-CM | POA: Diagnosis not present

## 2020-11-16 DIAGNOSIS — L720 Epidermal cyst: Secondary | ICD-10-CM | POA: Diagnosis not present

## 2020-11-16 DIAGNOSIS — Z7982 Long term (current) use of aspirin: Secondary | ICD-10-CM | POA: Diagnosis not present

## 2020-11-16 DIAGNOSIS — Z91041 Radiographic dye allergy status: Secondary | ICD-10-CM | POA: Diagnosis not present

## 2020-11-16 DIAGNOSIS — L821 Other seborrheic keratosis: Secondary | ICD-10-CM | POA: Diagnosis not present

## 2020-11-21 DIAGNOSIS — M1712 Unilateral primary osteoarthritis, left knee: Secondary | ICD-10-CM | POA: Diagnosis not present

## 2020-12-07 DIAGNOSIS — Z01118 Encounter for examination of ears and hearing with other abnormal findings: Secondary | ICD-10-CM | POA: Diagnosis not present

## 2020-12-07 DIAGNOSIS — L720 Epidermal cyst: Secondary | ICD-10-CM | POA: Diagnosis not present

## 2020-12-08 ENCOUNTER — Other Ambulatory Visit: Payer: Self-pay | Admitting: Cardiology

## 2020-12-12 DIAGNOSIS — L720 Epidermal cyst: Secondary | ICD-10-CM | POA: Diagnosis not present

## 2020-12-15 DIAGNOSIS — Z87891 Personal history of nicotine dependence: Secondary | ICD-10-CM | POA: Diagnosis not present

## 2020-12-15 DIAGNOSIS — Z880 Allergy status to penicillin: Secondary | ICD-10-CM | POA: Diagnosis not present

## 2020-12-15 DIAGNOSIS — Z91041 Radiographic dye allergy status: Secondary | ICD-10-CM | POA: Diagnosis not present

## 2020-12-15 DIAGNOSIS — Z7982 Long term (current) use of aspirin: Secondary | ICD-10-CM | POA: Diagnosis not present

## 2020-12-15 DIAGNOSIS — Z79899 Other long term (current) drug therapy: Secondary | ICD-10-CM | POA: Diagnosis not present

## 2020-12-15 DIAGNOSIS — I10 Essential (primary) hypertension: Secondary | ICD-10-CM | POA: Diagnosis not present

## 2020-12-15 DIAGNOSIS — L720 Epidermal cyst: Secondary | ICD-10-CM | POA: Diagnosis not present

## 2020-12-27 DIAGNOSIS — M79675 Pain in left toe(s): Secondary | ICD-10-CM | POA: Diagnosis not present

## 2020-12-27 DIAGNOSIS — L03032 Cellulitis of left toe: Secondary | ICD-10-CM | POA: Diagnosis not present

## 2020-12-29 DIAGNOSIS — L720 Epidermal cyst: Secondary | ICD-10-CM | POA: Diagnosis not present

## 2021-01-10 DIAGNOSIS — L03032 Cellulitis of left toe: Secondary | ICD-10-CM | POA: Diagnosis not present

## 2021-04-10 NOTE — Progress Notes (Signed)
? ? ?Cardiology Office Note ? ?Date: 04/11/2021  ? ?ID: Meredith Phelps, [place] 1940-01-30, MRN [id number] ? ?PCP:  Glenda Chroman, MD  ?Cardiologist:  Rozann Lesches, MD ?Electrophysiologist:  None  ? ?Chief Complaint  ?Patient presents with  ? Cardiac follow-up  ? ? ?History of Present Illness: ?Meredith Phelps is an 81 y.o. female last seen in April 2022.  She is here for a routine visit.  She tells me that she has not had any prolonged sense of palpitations, no chest pain or syncope. ? ?She stays active, actually still rides a [place] with friends. ? ?I reviewed her medications, she has done well on low-dose metoprolol.  Today's ECG shows sinus rhythm with decreased R wave progression. ? ?Past Medical History:  ?Diagnosis Date  ? Degenerative joint disease   ? Diverticulosis of colon   ? Essential hypertension   ? Palpitations   ? PSVT (paroxysmal supraventricular tachycardia) ([place])   ? Seasonal allergies   ? ? ?Past Surgical History:  ?Procedure Laterality Date  ? DILATION AND CURETTAGE OF UTERUS    ? ? ?Current Outpatient Medications  ?Medication Sig Dispense Refill  ? acetaminophen (TYLENOL) 325 MG tablet Take 650 mg by mouth as needed (tries to wait 12 hours after the Aleve).    ? aspirin EC 81 MG tablet Take 81 mg by mouth daily.    ? diazepam (VALIUM) 5 MG tablet Take 2.5 mg by mouth as needed for anxiety (nerves, has not taken in about 2 years till recently.).    ? furosemide (LASIX) 20 MG tablet Take 20 mg by mouth daily as needed.    ? hydrocortisone 2.5 % cream Apply 1 application topically 3 (three) times daily as needed.    ? lisinopril (ZESTRIL) 2.5 MG tablet TAKE 1 TABLET BY MOUTH DAILY 90 tablet 1  ? meclizine (ANTIVERT) 25 MG tablet Take 12.5-25 mg by mouth every 6 (six) hours as needed for dizziness.    ? metoprolol succinate (TOPROL-XL) 25 MG 24 hr tablet TAKE 1/2 TABLET BY MOUTH EVERY DAY 45 tablet 3  ? Naproxen Sodium (ALEVE PO) Take 2 tablets by mouth every morning.    ? omeprazole  (PRILOSEC) 20 MG capsule Take 20 mg by mouth daily.    ? potassium chloride (K-DUR) 10 MEQ tablet Take 10 mEq by mouth daily as needed.    ? sodium chloride (OCEAN) 0.65 % SOLN nasal spray Place 1 spray into both nostrils as needed for congestion.    ? ?No current facility-administered medications for this visit.  ? ?Allergies:  Ivp dye [iodinated contrast media], Shellfish allergy, and Penicillins  ? ?ROS: No orthopnea or PND. ? ?Physical Exam: ?VS:  BP 136/70   Pulse 66   Ht '5\' 8"'$  (1.727 m)   Wt 223 lb 3.2 oz (101.2 kg)   SpO2 97%   BMI 33.94 kg/m? , BMI Body mass index is 33.94 kg/m?. ? ?Wt Readings from Last 3 Encounters:  ?04/11/21 223 lb 3.2 oz (101.2 kg)  ?04/11/20 232 lb 3.2 oz (105.3 kg)  ?04/06/19 227 lb (103 kg)  ?  ?General: Patient appears comfortable at rest. ?HEENT: Conjunctiva and lids normal. ?Neck: Supple, no elevated JVP or carotid bruits, no thyromegaly. ?Lungs: Clear to auscultation, nonlabored breathing at rest. ?Cardiac: Regular rate and rhythm, no S3 or significant systolic murmur. ?Extremities: No pitting edema. ? ?ECG:  An ECG dated 04/11/2020 was personally reviewed today and demonstrated:  Sinus rhythm with low voltage. ? ?Recent  Labwork: ? ?November 2022: Hemoglobin 12.3, platelets 294, potassium 4.5, BUN 24, creatinine 0.75 ? ?Other Studies Reviewed Today: ? ?Echocardiogram 04/11/2015 [facility]): ?LVEF 19-75%, grade 1 diastolic dysfunction, mild left atrial enlargement, trace mitral regurgitation, mildly sclerotic aortic valve, normal estimated RVSP, trivial posterior basal pericardial effusion. ? ?Assessment and Plan: ? ?1.  History of PSVT, doing very well on low-dose Toprol-XL without any significant recurrences.  I reviewed her ECG today.  No dizziness or syncope.  We will continue with observation. ? ?2.  Essential hypertension, in addition to low-dose Toprol-XL she is also on lisinopril.  Systolic is in the 883G today.  No significant changes were  made. ? ?Medication Adjustments/Labs and Tests Ordered: ?Current medicines are reviewed at length with the patient today.  Concerns regarding medicines are outlined above.  ? ?Tests Ordered: ?Orders Placed This Encounter  ?Procedures  ? EKG 12-Lead  ? ? ?Medication Changes: ?No orders of the defined types were placed in this encounter. ? ? ?Disposition:  Follow up  1 year. ? ?Signed, ?Satira Sark, MD, [facility] ?04/11/2021 9:47 AM    ?[facility] ?[address], [place], [place] [address] ?Phone: [phone]; Fax: [phone]  ?

## 2021-04-11 ENCOUNTER — Encounter: Payer: Self-pay | Admitting: Cardiology

## 2021-04-11 ENCOUNTER — Ambulatory Visit (INDEPENDENT_AMBULATORY_CARE_PROVIDER_SITE_OTHER): Payer: PPO | Admitting: Cardiology

## 2021-04-11 VITALS — BP 136/70 | HR 66 | Ht 68.0 in | Wt 223.2 lb

## 2021-04-11 DIAGNOSIS — I471 Supraventricular tachycardia: Secondary | ICD-10-CM | POA: Diagnosis not present

## 2021-04-11 DIAGNOSIS — I10 Essential (primary) hypertension: Secondary | ICD-10-CM | POA: Diagnosis not present

## 2021-04-11 NOTE — Patient Instructions (Addendum)

## 2021-04-27 DIAGNOSIS — Z299 Encounter for prophylactic measures, unspecified: Secondary | ICD-10-CM | POA: Diagnosis not present

## 2021-04-27 DIAGNOSIS — R609 Edema, unspecified: Secondary | ICD-10-CM | POA: Diagnosis not present

## 2021-04-27 DIAGNOSIS — I10 Essential (primary) hypertension: Secondary | ICD-10-CM | POA: Diagnosis not present

## 2021-04-27 DIAGNOSIS — Z789 Other specified health status: Secondary | ICD-10-CM | POA: Diagnosis not present

## 2021-04-27 DIAGNOSIS — R42 Dizziness and giddiness: Secondary | ICD-10-CM | POA: Diagnosis not present

## 2021-05-02 DIAGNOSIS — Z299 Encounter for prophylactic measures, unspecified: Secondary | ICD-10-CM | POA: Diagnosis not present

## 2021-05-02 DIAGNOSIS — Z789 Other specified health status: Secondary | ICD-10-CM | POA: Diagnosis not present

## 2021-05-02 DIAGNOSIS — I10 Essential (primary) hypertension: Secondary | ICD-10-CM | POA: Diagnosis not present

## 2021-05-05 ENCOUNTER — Other Ambulatory Visit: Payer: Self-pay | Admitting: Cardiology

## 2021-05-23 DIAGNOSIS — I10 Essential (primary) hypertension: Secondary | ICD-10-CM | POA: Diagnosis not present

## 2021-05-23 DIAGNOSIS — Z299 Encounter for prophylactic measures, unspecified: Secondary | ICD-10-CM | POA: Diagnosis not present

## 2021-05-23 DIAGNOSIS — I471 Supraventricular tachycardia: Secondary | ICD-10-CM | POA: Diagnosis not present

## 2021-06-01 ENCOUNTER — Other Ambulatory Visit: Payer: Self-pay | Admitting: Internal Medicine

## 2021-06-01 DIAGNOSIS — Z1231 Encounter for screening mammogram for malignant neoplasm of breast: Secondary | ICD-10-CM

## 2021-07-07 DIAGNOSIS — K219 Gastro-esophageal reflux disease without esophagitis: Secondary | ICD-10-CM | POA: Diagnosis not present

## 2021-07-07 DIAGNOSIS — I10 Essential (primary) hypertension: Secondary | ICD-10-CM | POA: Diagnosis not present

## 2021-07-07 DIAGNOSIS — E7849 Other hyperlipidemia: Secondary | ICD-10-CM | POA: Diagnosis not present

## 2021-09-14 ENCOUNTER — Ambulatory Visit
Admission: RE | Admit: 2021-09-14 | Discharge: 2021-09-14 | Disposition: A | Discharge status: Home / Self Care | Payer: PPO | Source: Ambulatory Visit | Attending: Internal Medicine | Admitting: Internal Medicine

## 2021-09-14 DIAGNOSIS — Z1231 Encounter for screening mammogram for malignant neoplasm of breast: Secondary | ICD-10-CM | POA: Diagnosis not present

## 2021-09-18 ENCOUNTER — Other Ambulatory Visit: Payer: Self-pay | Admitting: Internal Medicine

## 2021-09-18 DIAGNOSIS — R928 Other abnormal and inconclusive findings on diagnostic imaging of breast: Secondary | ICD-10-CM

## 2021-10-02 IMAGING — MG DIGITAL SCREENING BILAT W/ TOMO W/ CAD
6 of 12 series · 6 of 36 positions shown · non-contrast
Comparison: Previous exam(s).

CLINICAL DATA: Screening.

EXAM:
DIGITAL SCREENING BILATERAL MAMMOGRAM WITH TOMO AND CAD

[L MLO synth-2D (1 of 2)]
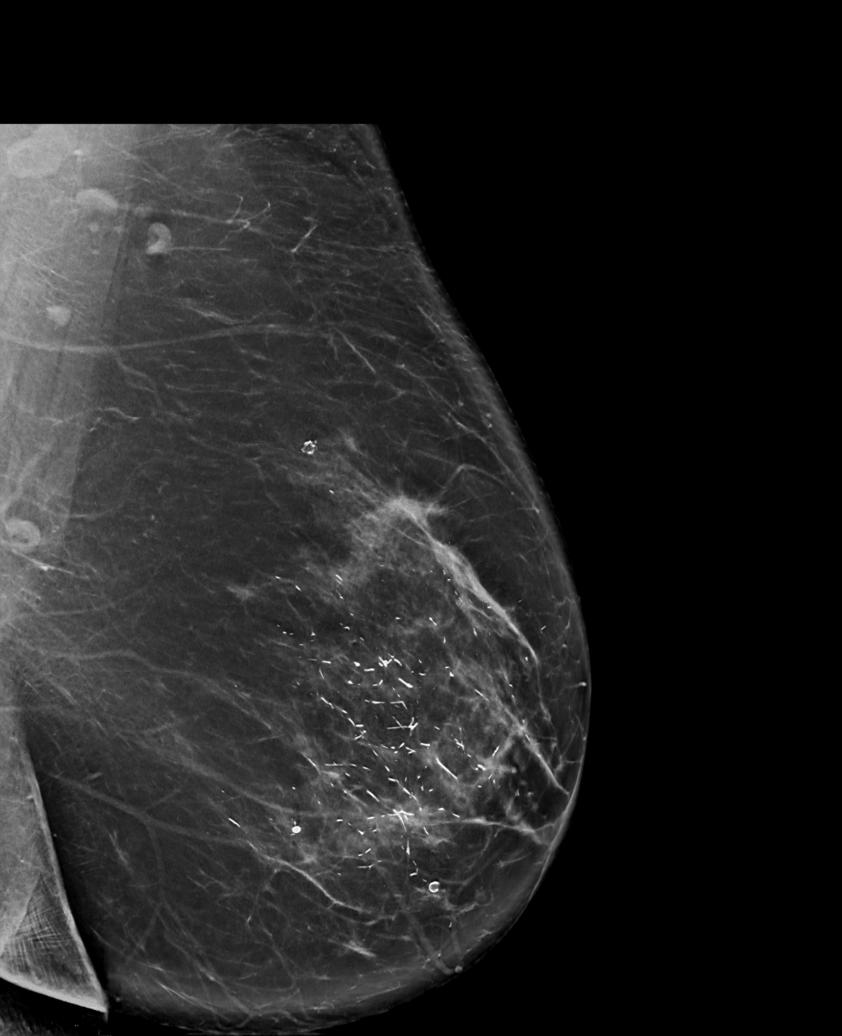

[L MLO synth-2D (2 of 2)]
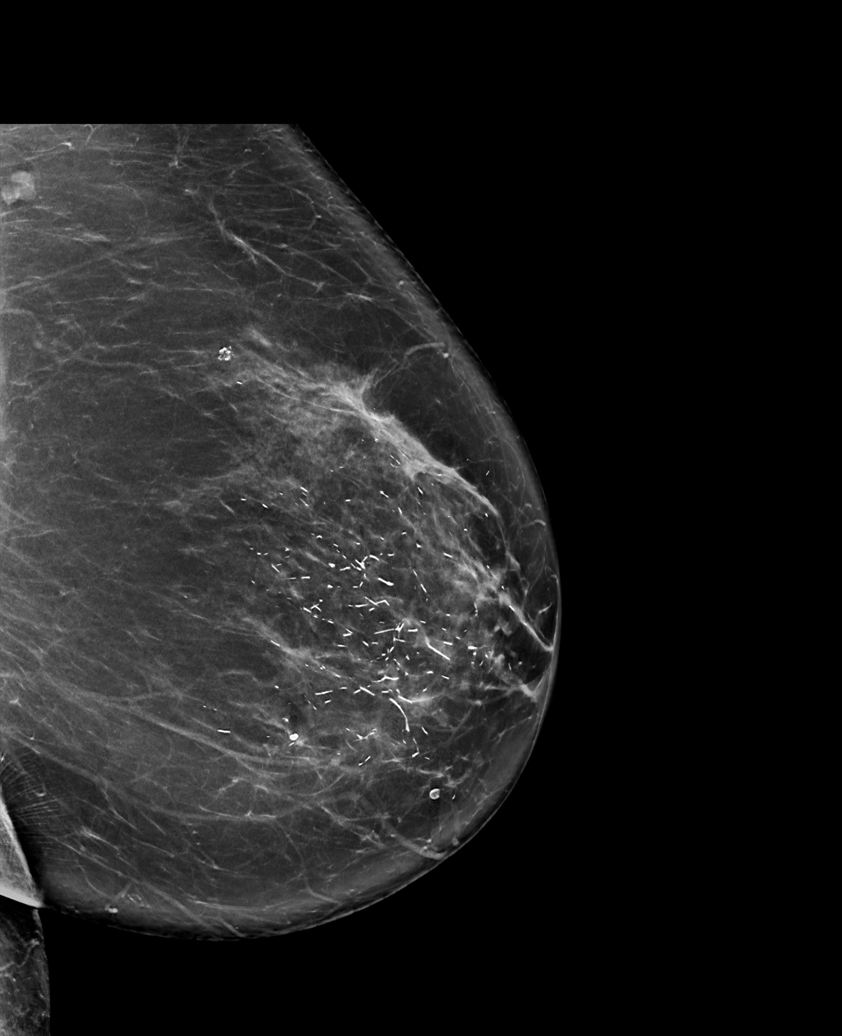

[R MLO synth-2D (1 of 2)]
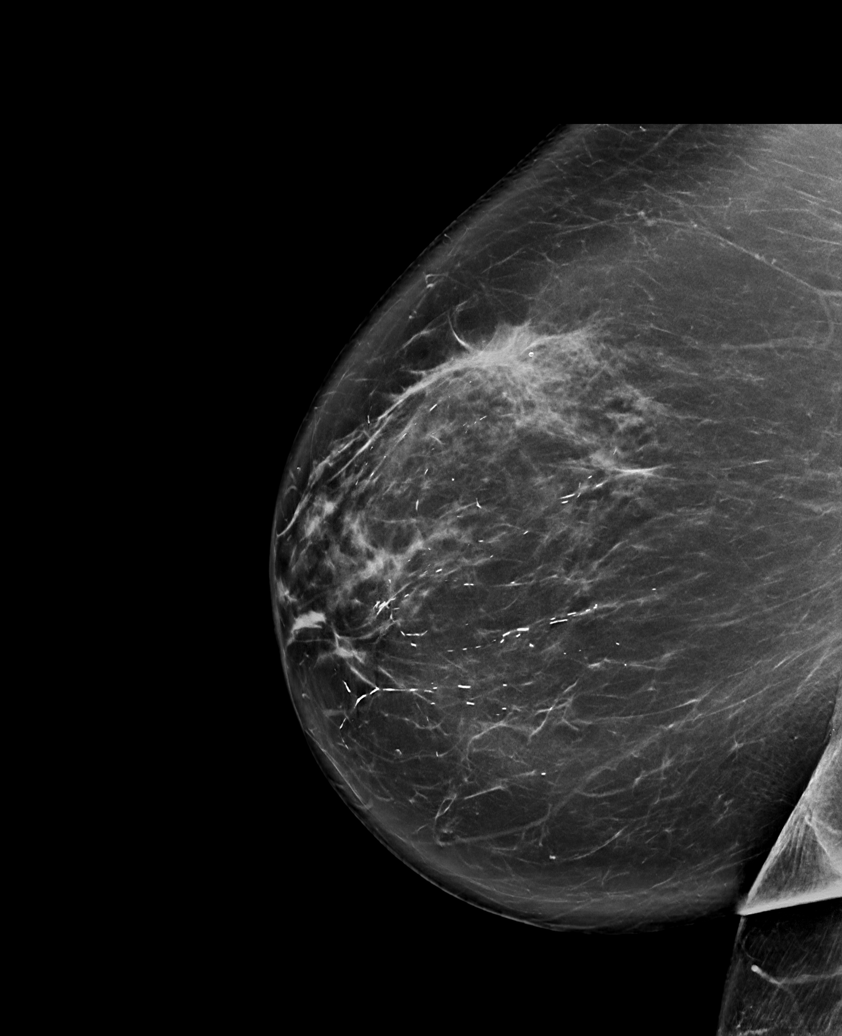

[L CC synth-2D]
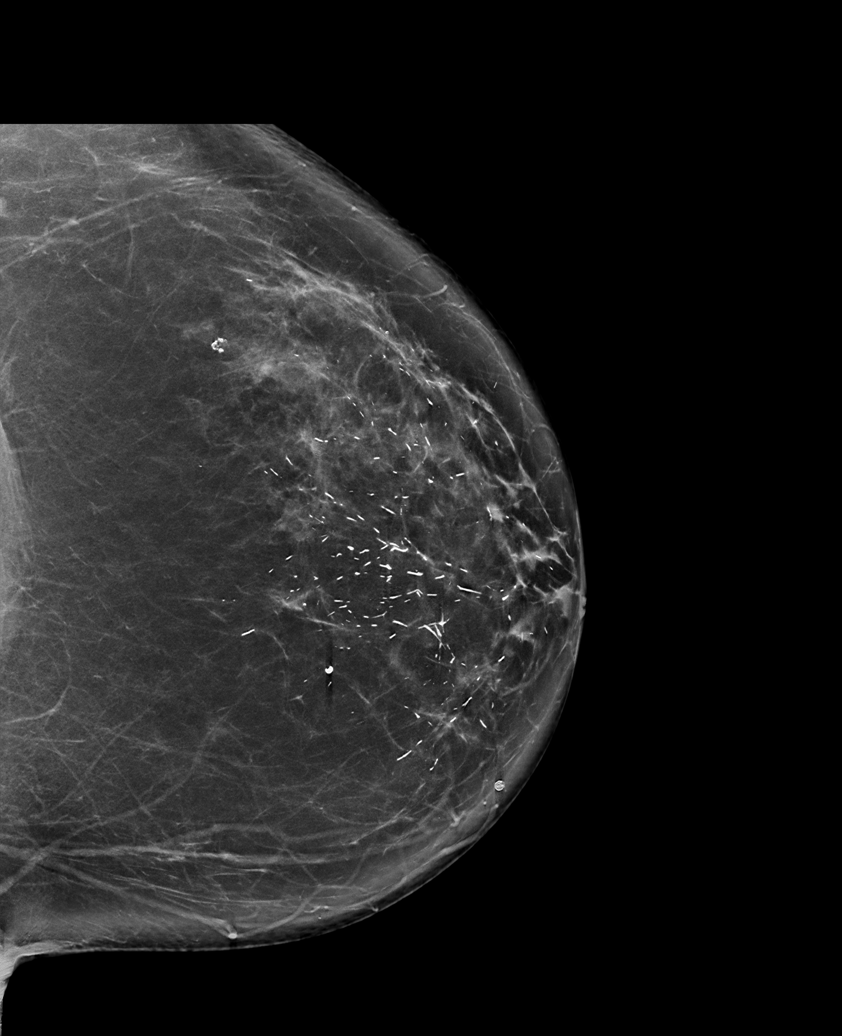

[R MLO synth-2D (2 of 2)]
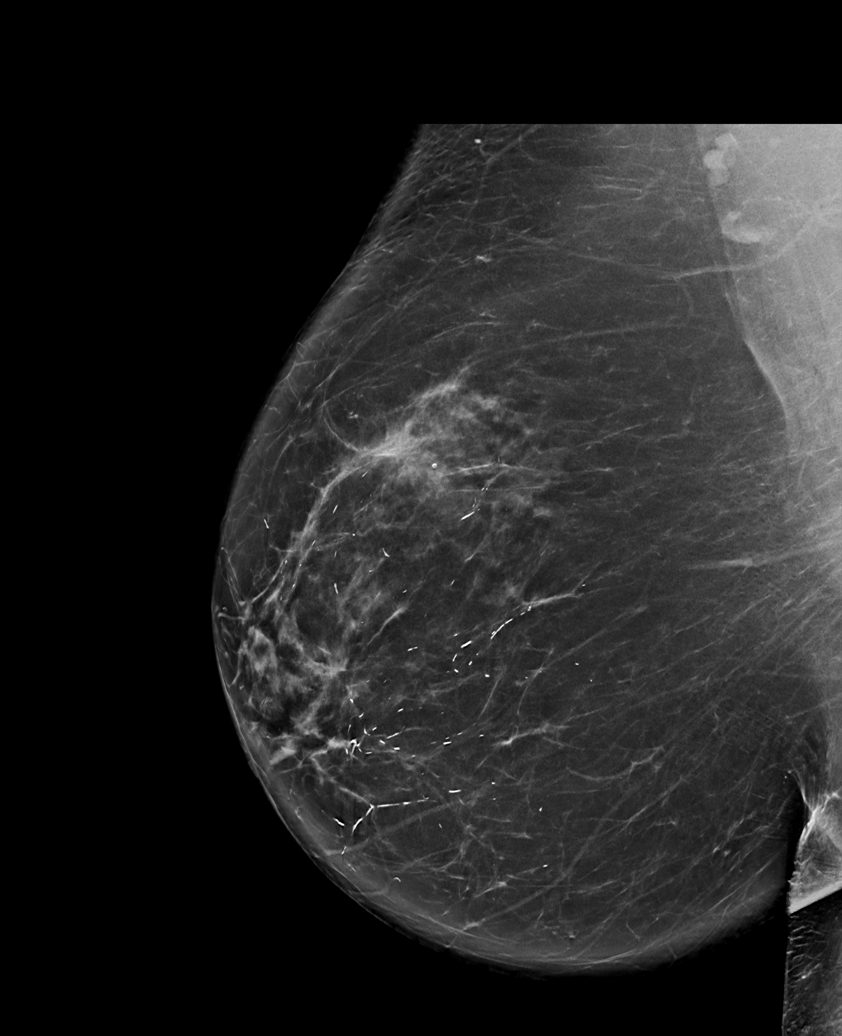

[R CC synth-2D]
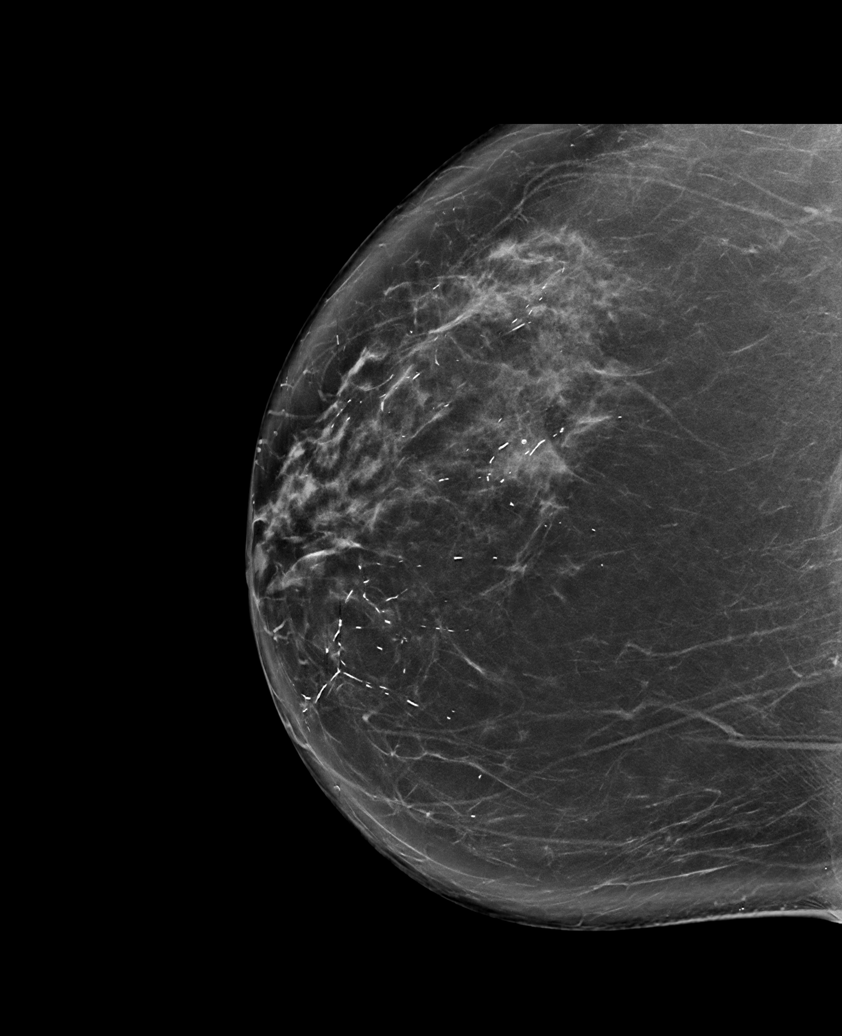

[6 of 36 positions shown; findings below may reference images not displayed]

ACR Breast Density Category b: There are scattered areas of
fibroglandular density.
FINDINGS: In the left breast, a possible mass warrants further evaluation. In
the right breast, no findings suspicious for malignancy.

Images were processed with CAD.
IMPRESSION: Further evaluation is suggested for possible mass in the left
breast.

RECOMMENDATION:
Diagnostic mammogram and possibly ultrasound of the left breast.
(Code:JC-2-SSL)

The patient will be contacted regarding the findings, and additional
imaging will be scheduled.

BI-RADS CATEGORY  0: Incomplete. Need additional imaging evaluation
and/or prior mammograms for comparison.

## 2021-10-03 ENCOUNTER — Ambulatory Visit: Payer: PPO

## 2021-10-03 ENCOUNTER — Ambulatory Visit
Admission: RE | Admit: 2021-10-03 | Discharge: 2021-10-03 | Disposition: A | Discharge status: Home / Self Care | Payer: PPO | Source: Ambulatory Visit | Attending: Internal Medicine | Admitting: Internal Medicine

## 2021-10-03 DIAGNOSIS — R922 Inconclusive mammogram: Secondary | ICD-10-CM | POA: Diagnosis not present

## 2021-10-03 DIAGNOSIS — R928 Other abnormal and inconclusive findings on diagnostic imaging of breast: Secondary | ICD-10-CM

## 2021-10-05 DIAGNOSIS — H35372 Puckering of macula, left eye: Secondary | ICD-10-CM | POA: Diagnosis not present

## 2021-10-05 DIAGNOSIS — D3131 Benign neoplasm of right choroid: Secondary | ICD-10-CM | POA: Diagnosis not present

## 2021-10-05 DIAGNOSIS — H26493 Other secondary cataract, bilateral: Secondary | ICD-10-CM | POA: Diagnosis not present

## 2021-10-05 DIAGNOSIS — H43811 Vitreous degeneration, right eye: Secondary | ICD-10-CM | POA: Diagnosis not present

## 2021-10-05 DIAGNOSIS — H353131 Nonexudative age-related macular degeneration, bilateral, early dry stage: Secondary | ICD-10-CM | POA: Diagnosis not present

## 2021-10-19 DIAGNOSIS — E7800 Pure hypercholesterolemia, unspecified: Secondary | ICD-10-CM | POA: Diagnosis not present

## 2021-10-19 DIAGNOSIS — Z1339 Encounter for screening examination for other mental health and behavioral disorders: Secondary | ICD-10-CM | POA: Diagnosis not present

## 2021-10-19 DIAGNOSIS — R5383 Other fatigue: Secondary | ICD-10-CM | POA: Diagnosis not present

## 2021-10-19 DIAGNOSIS — Z0000 Encounter for general adult medical examination without abnormal findings: Secondary | ICD-10-CM | POA: Diagnosis not present

## 2021-10-19 DIAGNOSIS — Z299 Encounter for prophylactic measures, unspecified: Secondary | ICD-10-CM | POA: Diagnosis not present

## 2021-10-19 DIAGNOSIS — Z1331 Encounter for screening for depression: Secondary | ICD-10-CM | POA: Diagnosis not present

## 2021-10-19 DIAGNOSIS — I10 Essential (primary) hypertension: Secondary | ICD-10-CM | POA: Diagnosis not present

## 2021-10-19 DIAGNOSIS — Z79899 Other long term (current) drug therapy: Secondary | ICD-10-CM | POA: Diagnosis not present

## 2021-10-19 DIAGNOSIS — Z6834 Body mass index (BMI) 34.0-34.9, adult: Secondary | ICD-10-CM | POA: Diagnosis not present

## 2021-10-19 DIAGNOSIS — Z7189 Other specified counseling: Secondary | ICD-10-CM | POA: Diagnosis not present

## 2021-11-22 ENCOUNTER — Other Ambulatory Visit: Payer: Self-pay | Admitting: Cardiology

## 2021-12-18 DIAGNOSIS — M25552 Pain in left hip: Secondary | ICD-10-CM | POA: Diagnosis not present

## 2021-12-18 DIAGNOSIS — M7062 Trochanteric bursitis, left hip: Secondary | ICD-10-CM | POA: Diagnosis not present

## 2021-12-18 DIAGNOSIS — M1712 Unilateral primary osteoarthritis, left knee: Secondary | ICD-10-CM | POA: Diagnosis not present

## 2021-12-18 DIAGNOSIS — M25562 Pain in left knee: Secondary | ICD-10-CM | POA: Diagnosis not present

## 2022-05-10 DIAGNOSIS — I10 Essential (primary) hypertension: Secondary | ICD-10-CM | POA: Diagnosis not present

## 2022-05-10 DIAGNOSIS — Z1331 Encounter for screening for depression: Secondary | ICD-10-CM | POA: Diagnosis not present

## 2022-05-10 DIAGNOSIS — I471 Supraventricular tachycardia, unspecified: Secondary | ICD-10-CM | POA: Diagnosis not present

## 2022-05-10 DIAGNOSIS — Z87891 Personal history of nicotine dependence: Secondary | ICD-10-CM | POA: Diagnosis not present

## 2022-05-10 DIAGNOSIS — Z299 Encounter for prophylactic measures, unspecified: Secondary | ICD-10-CM | POA: Diagnosis not present

## 2022-05-10 DIAGNOSIS — Z0000 Encounter for general adult medical examination without abnormal findings: Secondary | ICD-10-CM | POA: Diagnosis not present

## 2022-05-10 DIAGNOSIS — E7800 Pure hypercholesterolemia, unspecified: Secondary | ICD-10-CM | POA: Diagnosis not present

## 2022-05-10 DIAGNOSIS — Z6834 Body mass index (BMI) 34.0-34.9, adult: Secondary | ICD-10-CM | POA: Diagnosis not present

## 2022-05-10 DIAGNOSIS — R5383 Other fatigue: Secondary | ICD-10-CM | POA: Diagnosis not present

## 2022-05-10 DIAGNOSIS — Z7189 Other specified counseling: Secondary | ICD-10-CM | POA: Diagnosis not present

## 2022-05-10 DIAGNOSIS — Z1339 Encounter for screening examination for other mental health and behavioral disorders: Secondary | ICD-10-CM | POA: Diagnosis not present

## 2022-07-11 ENCOUNTER — Encounter: Payer: Self-pay | Admitting: Cardiology

## 2022-07-11 ENCOUNTER — Ambulatory Visit: Payer: PPO | Attending: Cardiology | Admitting: Cardiology

## 2022-07-11 VITALS — BP 138/68 | HR 60 | Ht 68.0 in | Wt 216.4 lb

## 2022-07-11 DIAGNOSIS — I10 Essential (primary) hypertension: Secondary | ICD-10-CM

## 2022-07-11 DIAGNOSIS — I471 Supraventricular tachycardia, unspecified: Secondary | ICD-10-CM

## 2022-07-11 NOTE — Progress Notes (Signed)
    Cardiology Office Note  Date: 07/11/2022   ID: Meredith Phelps, Meredith Phelps Jan 19, 1940, MRN [id number]  History of Present Illness: Meredith Phelps is an 82 y.o. female last seen in April 2023.  She is here for a routine visit.  Reports no significant palpitations on low-dose Toprol-XL which she has tolerated well.  No major change in stamina or increasing shortness of breath with activity.  I reviewed her medications.  She continues to follow with Dr. Sherril Croon.  ECG today shows sinus rhythm with decreased R wave progression.  Physical Exam: VS:  BP 138/68   Pulse 60   Ht 5\' 8"  (1.727 m)   Wt 216 lb 6.4 oz (98.2 kg)   SpO2 95%   BMI 32.90 kg/m , BMI Body mass index is 32.9 kg/m.  Wt Readings from Last 3 Encounters:  07/11/22 216 lb 6.4 oz (98.2 kg)  04/11/21 223 lb 3.2 oz (101.2 kg)  04/11/20 232 lb 3.2 oz (105.3 kg)    General: Patient appears comfortable at rest. HEENT: Conjunctiva and lids normal. Neck: Supple, no elevated JVP or carotid bruits. Lungs: Clear to auscultation, nonlabored breathing at rest. Cardiac: Regular rate and rhythm, no S3 or significant systolic murmur. Extremities: No pitting edema.  ECG:  An ECG dated 04/11/2021 was personally reviewed today and demonstrated:  Sinus rhythm with decreased R wave progression.  Labwork:  May 2024: Hemoglobin 12.4, platelets 324, BUN 25, creatinine 0.78, potassium 4.7, AST 16, ALT 16, cholesterol 223, triglycerides 77, HDL 82, LDL 128, TSH 2.19  Other Studies Reviewed Today:  No interval cardiac testing for review today.  Assessment and Plan:  1.  PSVT.  Quiescent on low-dose Toprol-XL.  ECG reviewed and stable.  Continue with observation.  2.  Essential hypertension.  Blood pressure reasonable today, she is also on lisinopril.  Keep follow-up with Dr. Sherril Croon.  Disposition:  Follow up  1 year.  Signed, Jonelle Sidle, M.D., F.A.C.C. [facility]

## 2022-07-11 NOTE — Patient Instructions (Addendum)

## 2022-09-14 ENCOUNTER — Other Ambulatory Visit: Payer: Self-pay | Admitting: Internal Medicine

## 2022-09-14 DIAGNOSIS — Z1231 Encounter for screening mammogram for malignant neoplasm of breast: Secondary | ICD-10-CM

## 2022-09-25 DIAGNOSIS — R42 Dizziness and giddiness: Secondary | ICD-10-CM | POA: Diagnosis not present

## 2022-09-25 DIAGNOSIS — I10 Essential (primary) hypertension: Secondary | ICD-10-CM | POA: Diagnosis not present

## 2022-09-25 DIAGNOSIS — Z299 Encounter for prophylactic measures, unspecified: Secondary | ICD-10-CM | POA: Diagnosis not present

## 2022-10-05 DIAGNOSIS — I10 Essential (primary) hypertension: Secondary | ICD-10-CM | POA: Diagnosis not present

## 2022-10-05 DIAGNOSIS — R531 Weakness: Secondary | ICD-10-CM | POA: Diagnosis not present

## 2022-10-05 DIAGNOSIS — Z299 Encounter for prophylactic measures, unspecified: Secondary | ICD-10-CM | POA: Diagnosis not present

## 2022-10-05 DIAGNOSIS — R609 Edema, unspecified: Secondary | ICD-10-CM | POA: Diagnosis not present

## 2022-10-10 ENCOUNTER — Ambulatory Visit
Admission: RE | Admit: 2022-10-10 | Discharge: 2022-10-10 | Disposition: A | Discharge status: Home / Self Care | Payer: PPO | Source: Ambulatory Visit | Attending: Internal Medicine | Admitting: Internal Medicine

## 2022-10-10 DIAGNOSIS — Z1231 Encounter for screening mammogram for malignant neoplasm of breast: Secondary | ICD-10-CM

## 2022-10-31 DIAGNOSIS — I10 Essential (primary) hypertension: Secondary | ICD-10-CM | POA: Diagnosis not present

## 2022-10-31 DIAGNOSIS — Z0000 Encounter for general adult medical examination without abnormal findings: Secondary | ICD-10-CM | POA: Diagnosis not present

## 2022-10-31 DIAGNOSIS — Z299 Encounter for prophylactic measures, unspecified: Secondary | ICD-10-CM | POA: Diagnosis not present

## 2022-10-31 DIAGNOSIS — I471 Supraventricular tachycardia, unspecified: Secondary | ICD-10-CM | POA: Diagnosis not present

## 2022-11-01 DIAGNOSIS — H26493 Other secondary cataract, bilateral: Secondary | ICD-10-CM | POA: Diagnosis not present

## 2022-11-01 DIAGNOSIS — H35372 Puckering of macula, left eye: Secondary | ICD-10-CM | POA: Diagnosis not present

## 2022-11-01 DIAGNOSIS — H43811 Vitreous degeneration, right eye: Secondary | ICD-10-CM | POA: Diagnosis not present

## 2022-11-01 DIAGNOSIS — D3131 Benign neoplasm of right choroid: Secondary | ICD-10-CM | POA: Diagnosis not present

## 2022-11-01 DIAGNOSIS — H353131 Nonexudative age-related macular degeneration, bilateral, early dry stage: Secondary | ICD-10-CM | POA: Diagnosis not present

## 2022-11-06 DIAGNOSIS — I10 Essential (primary) hypertension: Secondary | ICD-10-CM | POA: Diagnosis not present

## 2022-11-06 DIAGNOSIS — D485 Neoplasm of uncertain behavior of skin: Secondary | ICD-10-CM | POA: Diagnosis not present

## 2022-11-06 DIAGNOSIS — Z299 Encounter for prophylactic measures, unspecified: Secondary | ICD-10-CM | POA: Diagnosis not present

## 2022-11-06 DIAGNOSIS — C44319 Basal cell carcinoma of skin of other parts of face: Secondary | ICD-10-CM | POA: Diagnosis not present

## 2022-11-09 ENCOUNTER — Other Ambulatory Visit: Payer: Self-pay | Admitting: Cardiology

## 2022-11-30 DIAGNOSIS — Z299 Encounter for prophylactic measures, unspecified: Secondary | ICD-10-CM | POA: Diagnosis not present

## 2022-11-30 DIAGNOSIS — C44319 Basal cell carcinoma of skin of other parts of face: Secondary | ICD-10-CM | POA: Diagnosis not present

## 2022-11-30 DIAGNOSIS — I10 Essential (primary) hypertension: Secondary | ICD-10-CM | POA: Diagnosis not present

## 2023-01-16 DIAGNOSIS — R5383 Other fatigue: Secondary | ICD-10-CM | POA: Diagnosis not present

## 2023-01-16 DIAGNOSIS — R197 Diarrhea, unspecified: Secondary | ICD-10-CM | POA: Diagnosis not present

## 2023-04-18 DIAGNOSIS — Z0000 Encounter for general adult medical examination without abnormal findings: Secondary | ICD-10-CM | POA: Diagnosis not present

## 2023-04-18 DIAGNOSIS — I10 Essential (primary) hypertension: Secondary | ICD-10-CM | POA: Diagnosis not present

## 2023-04-18 DIAGNOSIS — Z299 Encounter for prophylactic measures, unspecified: Secondary | ICD-10-CM | POA: Diagnosis not present

## 2023-04-18 DIAGNOSIS — Z1339 Encounter for screening examination for other mental health and behavioral disorders: Secondary | ICD-10-CM | POA: Diagnosis not present

## 2023-04-18 DIAGNOSIS — I471 Supraventricular tachycardia, unspecified: Secondary | ICD-10-CM | POA: Diagnosis not present

## 2023-04-18 DIAGNOSIS — Z1331 Encounter for screening for depression: Secondary | ICD-10-CM | POA: Diagnosis not present

## 2023-04-18 DIAGNOSIS — Z7189 Other specified counseling: Secondary | ICD-10-CM | POA: Diagnosis not present

## 2023-04-18 DIAGNOSIS — K219 Gastro-esophageal reflux disease without esophagitis: Secondary | ICD-10-CM | POA: Diagnosis not present

## 2023-07-25 ENCOUNTER — Ambulatory Visit: Attending: Cardiology | Admitting: Cardiology

## 2023-07-25 ENCOUNTER — Encounter: Payer: Self-pay | Admitting: Cardiology

## 2023-07-25 VITALS — BP 124/68 | HR 61 | Ht 68.0 in | Wt 210.0 lb

## 2023-07-25 DIAGNOSIS — I10 Essential (primary) hypertension: Secondary | ICD-10-CM | POA: Diagnosis not present

## 2023-07-25 DIAGNOSIS — E782 Mixed hyperlipidemia: Secondary | ICD-10-CM | POA: Diagnosis not present

## 2023-07-25 DIAGNOSIS — I471 Supraventricular tachycardia, unspecified: Secondary | ICD-10-CM | POA: Diagnosis not present

## 2023-07-25 NOTE — Progress Notes (Signed)
    Cardiology Office Note  Date: 07/25/2023   ID: Monalisa, [place] July 03, 1940, MRN [id number]  History of Present Illness: Meredith Phelps is an 83 y.o. female last seen in July 2024.  She is here for a routine visit.  She does not report any definite progression in symptoms associated with PSVT, specifically no palpitations, only brief episodes of breathlessness.  She has had no sudden dizziness or syncope.  Has had some orthostatic lightheadedness when she stands up quickly.  We went over her medications.  She is following with Dr. Vyas, lisinopril  dose has been increased over time.  I rechecked her blood pressure as noted below.  Measurements initially elevated when she came into the office.  I reviewed her ECG today which shows normal sinus rhythm with decreased R wave progression as before.  Physical Exam: VS:  BP 124/68 (BP Location: Left Arm)   Pulse 61   Ht 5' 8 (1.727 m)   Wt 210 lb (95.3 kg)   SpO2 95%   BMI 31.93 kg/m , BMI Body mass index is 31.93 kg/m.  Wt Readings from Last 3 Encounters:  07/25/23 210 lb (95.3 kg)  07/11/22 216 lb 6.4 oz (98.2 kg)  04/11/21 223 lb 3.2 oz (101.2 kg)    General: Patient appears comfortable at rest. HEENT: Conjunctiva and lids normal. Neck: Supple, no elevated JVP or carotid bruits. Lungs: Clear to auscultation, nonlabored breathing at rest. Cardiac: Regular rate and rhythm, no S3 or significant systolic murmur.  ECG:  An ECG dated 07/11/2022 was personally reviewed today and demonstrated:  Sinus rhythm with decreased R wave progression.  Labwork:  May 2024: Hemoglobin 12.4, platelets 324, BUN 25, creatinine 0.78, potassium 4.7, GFR 76, AST 16, ALT 16, TSH 2.27 October 2022: Cholesterol 243, triglycerides 113, HDL 72, LDL 151  Other Studies Reviewed Today:  No interval cardiac testing for review today.  Assessment and Plan:  1.  PSVT.  No progressive symptoms on Toprol  XL 25 mg daily.  ECG shows sinus rhythm  today.  Continue observation.   2.  Primary hypertension.  Blood pressure rechecked by me today and both arms in normal range.  Continue lisinopril  10 mg daily and keep follow-up with PCP.  3.  Mixed hyperlipidemia.  Lab work from October 2024 revealed HDL 72 and LDL 151.  She has been managing this via diet with follow-up by PCP.  Disposition:  Follow up 1 year.  Signed, Jayson JUDITHANN Sierras, M.D., F.A.C.C. [facility] at [facility]

## 2023-07-25 NOTE — Patient Instructions (Addendum)

## 2023-09-02 DIAGNOSIS — M1711 Unilateral primary osteoarthritis, right knee: Secondary | ICD-10-CM | POA: Diagnosis not present

## 2023-09-02 DIAGNOSIS — M1611 Unilateral primary osteoarthritis, right hip: Secondary | ICD-10-CM | POA: Diagnosis not present

## 2023-10-01 ENCOUNTER — Other Ambulatory Visit: Payer: Self-pay | Admitting: Internal Medicine

## 2023-10-01 DIAGNOSIS — Z1231 Encounter for screening mammogram for malignant neoplasm of breast: Secondary | ICD-10-CM

## 2023-10-11 ENCOUNTER — Ambulatory Visit
Admission: RE | Admit: 2023-10-11 | Discharge: 2023-10-11 | Disposition: A | Discharge status: Home / Self Care | Source: Ambulatory Visit | Attending: Internal Medicine | Admitting: Internal Medicine

## 2023-10-11 DIAGNOSIS — Z1231 Encounter for screening mammogram for malignant neoplasm of breast: Secondary | ICD-10-CM | POA: Diagnosis not present

## 2023-11-06 DIAGNOSIS — I10 Essential (primary) hypertension: Secondary | ICD-10-CM | POA: Diagnosis not present

## 2023-11-06 DIAGNOSIS — R5383 Other fatigue: Secondary | ICD-10-CM | POA: Diagnosis not present

## 2023-11-06 DIAGNOSIS — M79651 Pain in right thigh: Secondary | ICD-10-CM | POA: Diagnosis not present

## 2023-11-06 DIAGNOSIS — Z79899 Other long term (current) drug therapy: Secondary | ICD-10-CM | POA: Diagnosis not present

## 2023-11-06 DIAGNOSIS — Z0000 Encounter for general adult medical examination without abnormal findings: Secondary | ICD-10-CM | POA: Diagnosis not present

## 2023-11-06 DIAGNOSIS — E7800 Pure hypercholesterolemia, unspecified: Secondary | ICD-10-CM | POA: Diagnosis not present

## 2023-11-06 DIAGNOSIS — Z299 Encounter for prophylactic measures, unspecified: Secondary | ICD-10-CM | POA: Diagnosis not present

## 2023-11-14 ENCOUNTER — Other Ambulatory Visit: Payer: Self-pay | Admitting: Cardiology

## 2023-12-12 DIAGNOSIS — D3131 Benign neoplasm of right choroid: Secondary | ICD-10-CM | POA: Diagnosis not present

## 2023-12-12 DIAGNOSIS — H353131 Nonexudative age-related macular degeneration, bilateral, early dry stage: Secondary | ICD-10-CM | POA: Diagnosis not present

## 2023-12-12 DIAGNOSIS — H35372 Puckering of macula, left eye: Secondary | ICD-10-CM | POA: Diagnosis not present

## 2023-12-12 DIAGNOSIS — H43811 Vitreous degeneration, right eye: Secondary | ICD-10-CM | POA: Diagnosis not present

## 2023-12-12 DIAGNOSIS — H26492 Other secondary cataract, left eye: Secondary | ICD-10-CM | POA: Diagnosis not present
